# Patient Record
Sex: Female | Born: 1960 | Race: White | Hispanic: No | Smoking: Never smoker
Health system: Southern US, Community
[De-identification: ages and names within clinical notes are randomized; demographics above are authoritative.]

## PROBLEM LIST (undated history)

## (undated) DIAGNOSIS — Z9089 Acquired absence of other organs: Secondary | ICD-10-CM

## (undated) DIAGNOSIS — N760 Acute vaginitis: Secondary | ICD-10-CM

## (undated) DIAGNOSIS — I341 Nonrheumatic mitral (valve) prolapse: Secondary | ICD-10-CM

## (undated) HISTORY — DX: Nonrheumatic mitral (valve) prolapse: I34.1

## (undated) HISTORY — PX: TONSILLECTOMY: SUR1361

## (undated) HISTORY — DX: Acute vaginitis: N76.0

## (undated) HISTORY — DX: Acquired absence of other organs: Z90.89

---

## 2006-09-07 ENCOUNTER — Encounter: Payer: Self-pay | Admitting: Family Medicine

## 2006-09-07 ENCOUNTER — Encounter (INDEPENDENT_AMBULATORY_CARE_PROVIDER_SITE_OTHER): Payer: Self-pay | Admitting: Specialist

## 2006-09-07 ENCOUNTER — Other Ambulatory Visit: Admission: RE | Admit: 2006-09-07 | Discharge: 2006-09-07 | Payer: Self-pay | Admitting: Family Medicine

## 2006-09-07 ENCOUNTER — Ambulatory Visit: Payer: Self-pay | Admitting: Family Medicine

## 2006-09-07 LAB — CONVERTED CEMR LAB

## 2006-09-14 ENCOUNTER — Encounter: Payer: Self-pay | Admitting: Family Medicine

## 2006-09-18 ENCOUNTER — Encounter: Payer: Self-pay | Admitting: Family Medicine

## 2006-09-18 LAB — CONVERTED CEMR LAB
ALT: 18 units/L (ref 0–40)
Calcium: 9 mg/dL (ref 8.4–10.5)
Chloride: 104 meq/L (ref 96–112)
Cholesterol: 192 mg/dL (ref 0–200)
HDL: 48 mg/dL (ref >39)
LDL Cholesterol: 113 mg/dL — ABNORMAL HIGH (ref 0–99)
Potassium: 4.6 meq/L (ref 3.5–5.3)
Sodium: 140 meq/L (ref 135–145)
Total Bilirubin: 0.8 mg/dL (ref 0.3–1.2)
Total CHOL/HDL Ratio: 4
Triglycerides: 154 mg/dL — ABNORMAL HIGH (ref <150)

## 2006-11-07 ENCOUNTER — Encounter: Payer: Self-pay | Admitting: Family Medicine

## 2007-01-23 ENCOUNTER — Ambulatory Visit: Payer: Self-pay | Admitting: Family Medicine

## 2007-01-23 LAB — CONVERTED CEMR LAB
Bilirubin Urine: NEGATIVE
Glucose, Urine, Semiquant: NEGATIVE
WBC Urine, dipstick: NEGATIVE
pH: 5.5

## 2007-01-24 ENCOUNTER — Encounter: Payer: Self-pay | Admitting: Family Medicine

## 2007-01-24 ENCOUNTER — Telehealth (INDEPENDENT_AMBULATORY_CARE_PROVIDER_SITE_OTHER): Payer: Self-pay | Admitting: *Deleted

## 2007-05-01 ENCOUNTER — Ambulatory Visit: Payer: Self-pay | Admitting: Family Medicine

## 2007-05-01 DIAGNOSIS — N760 Acute vaginitis: Secondary | ICD-10-CM | POA: Insufficient documentation

## 2007-05-01 DIAGNOSIS — R109 Unspecified abdominal pain: Secondary | ICD-10-CM

## 2007-05-01 DIAGNOSIS — N390 Urinary tract infection, site not specified: Secondary | ICD-10-CM

## 2007-05-01 LAB — CONVERTED CEMR LAB
Clue Cells Wet Prep HPF POC: NONE SEEN
Glucose, Urine, Semiquant: NEGATIVE
Specific Gravity, Urine: 1.02
pH: 5.5

## 2007-05-07 ENCOUNTER — Ambulatory Visit: Payer: Self-pay | Admitting: Family Medicine

## 2007-05-07 DIAGNOSIS — R1013 Epigastric pain: Secondary | ICD-10-CM

## 2007-05-17 ENCOUNTER — Ambulatory Visit: Payer: Self-pay | Admitting: Family Medicine

## 2007-05-17 DIAGNOSIS — K3189 Other diseases of stomach and duodenum: Secondary | ICD-10-CM | POA: Insufficient documentation

## 2007-05-17 DIAGNOSIS — R319 Hematuria, unspecified: Secondary | ICD-10-CM | POA: Insufficient documentation

## 2007-05-17 DIAGNOSIS — R1013 Epigastric pain: Secondary | ICD-10-CM

## 2007-05-17 DIAGNOSIS — N926 Irregular menstruation, unspecified: Secondary | ICD-10-CM

## 2007-05-17 LAB — CONVERTED CEMR LAB
Ketones, urine, test strip: NEGATIVE
Nitrite: NEGATIVE
Specific Gravity, Urine: >=1.03
Urobilinogen, UA: NEGATIVE

## 2007-06-17 ENCOUNTER — Encounter: Payer: Self-pay | Admitting: Family Medicine

## 2007-07-17 ENCOUNTER — Ambulatory Visit: Payer: Self-pay | Admitting: Family Medicine

## 2007-07-17 DIAGNOSIS — K8020 Calculus of gallbladder without cholecystitis without obstruction: Secondary | ICD-10-CM | POA: Insufficient documentation

## 2007-07-17 LAB — CONVERTED CEMR LAB
ALT: 10 units/L (ref 0–35)
AST: 12 units/L (ref 0–37)
Albumin: 4.4 g/dL (ref 3.5–5.2)
BUN: 9 mg/dL (ref 6–23)
CO2: 24 meq/L (ref 19–32)
Creatinine, Ser: 0.86 mg/dL (ref 0.40–1.20)
Glucose, Bld: 77 mg/dL (ref 70–99)
HCT: 43.6 % (ref 36.0–46.0)
MCHC: 31.9 g/dL (ref 30.0–36.0)
MCV: 93.8 fL (ref 78.0–100.0)
Potassium: 4 meq/L (ref 3.5–5.3)
RDW: 13.4 % (ref 11.5–14.0)
Total Protein: 7.1 g/dL (ref 6.0–8.3)

## 2007-08-21 ENCOUNTER — Ambulatory Visit (HOSPITAL_COMMUNITY): Admission: RE | Admit: 2007-08-21 | Discharge: 2007-08-21 | Payer: Self-pay | Admitting: Surgery

## 2007-08-21 ENCOUNTER — Encounter (INDEPENDENT_AMBULATORY_CARE_PROVIDER_SITE_OTHER): Payer: Self-pay | Admitting: Surgery

## 2007-08-21 HISTORY — PX: CHOLECYSTECTOMY: SHX55

## 2007-09-09 ENCOUNTER — Encounter: Payer: Self-pay | Admitting: Family Medicine

## 2008-01-14 IMAGING — RF DG CHOLANGIOGRAM OPERATIVE
1 series · 4 of 4 positions shown · non-contrast
Comparison: none

CLINICAL DATA: Cholelithiasis

[Series 1: run · 4 of 73 frames shown]
[frame 11/73]
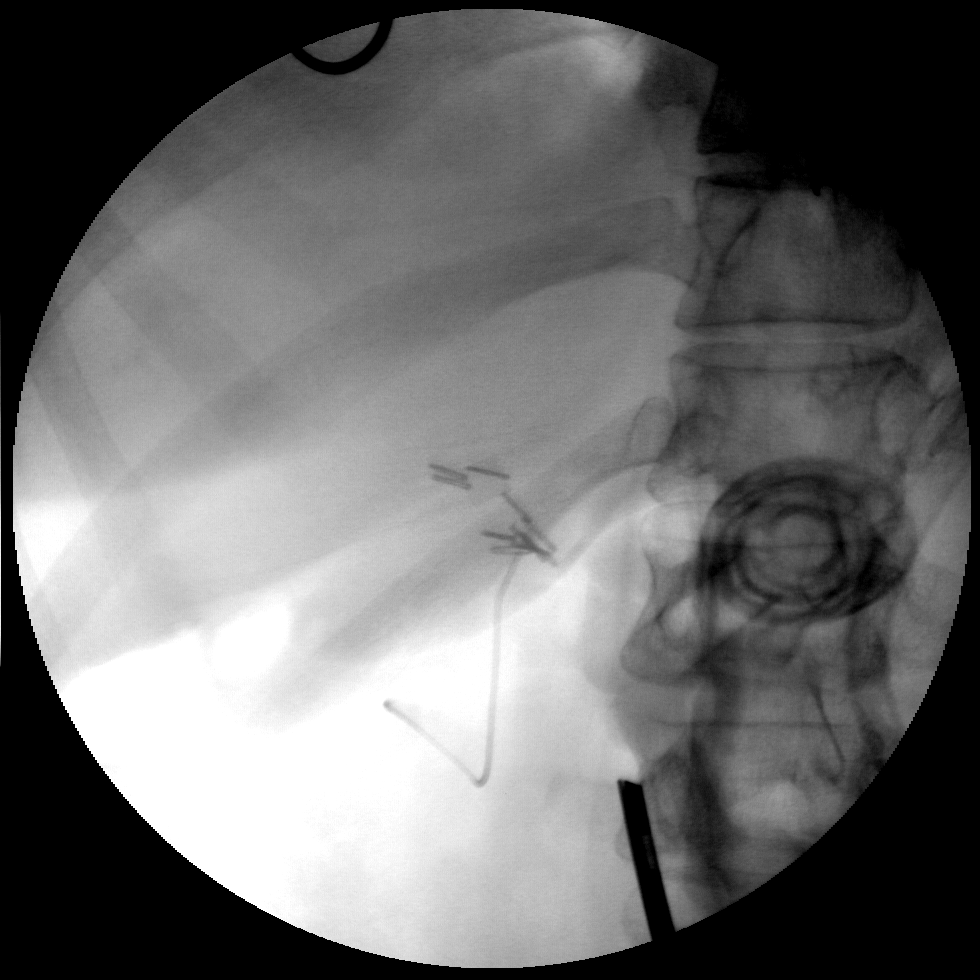
[frame 37/73]
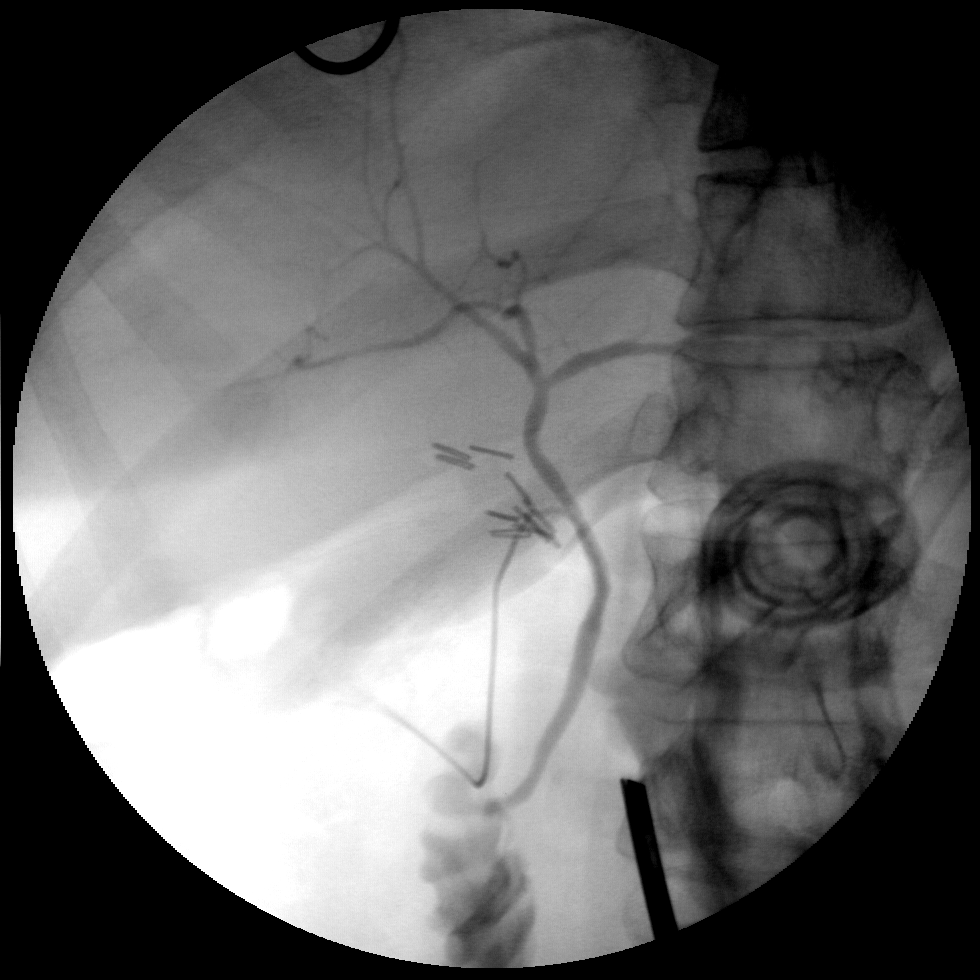
[frame 63/73]
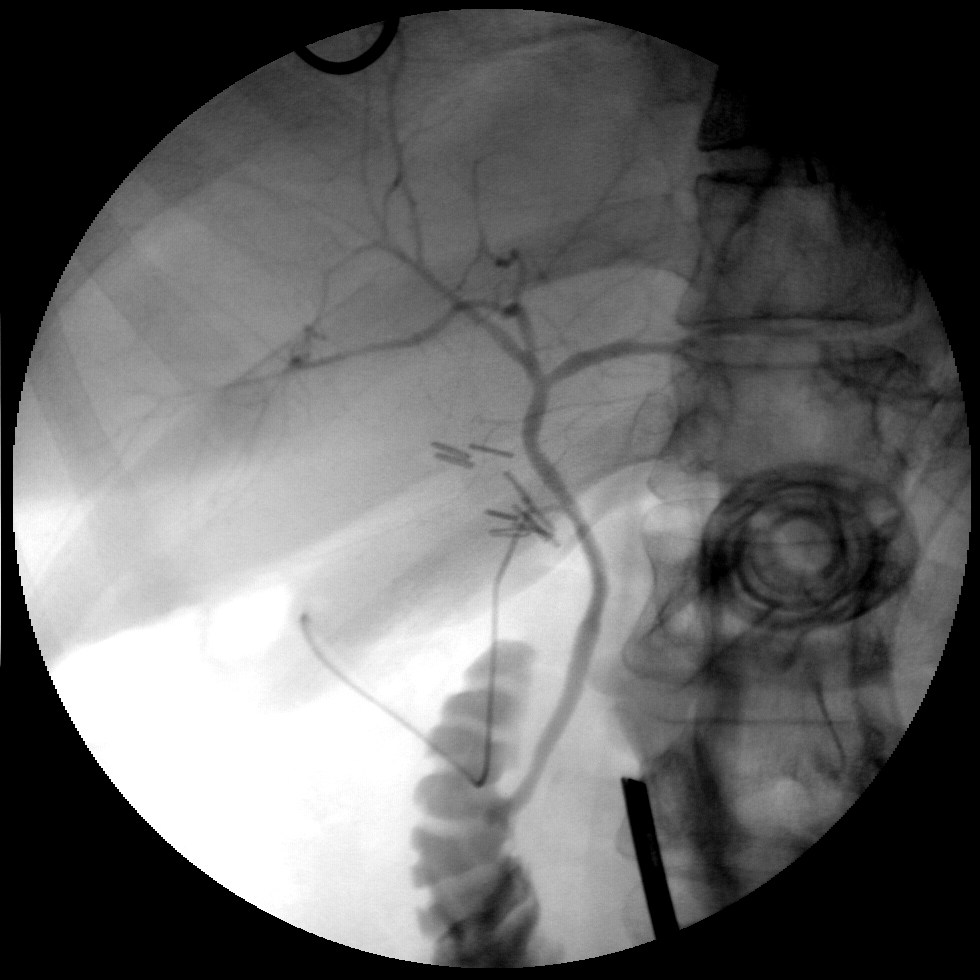
[frame 73/73]
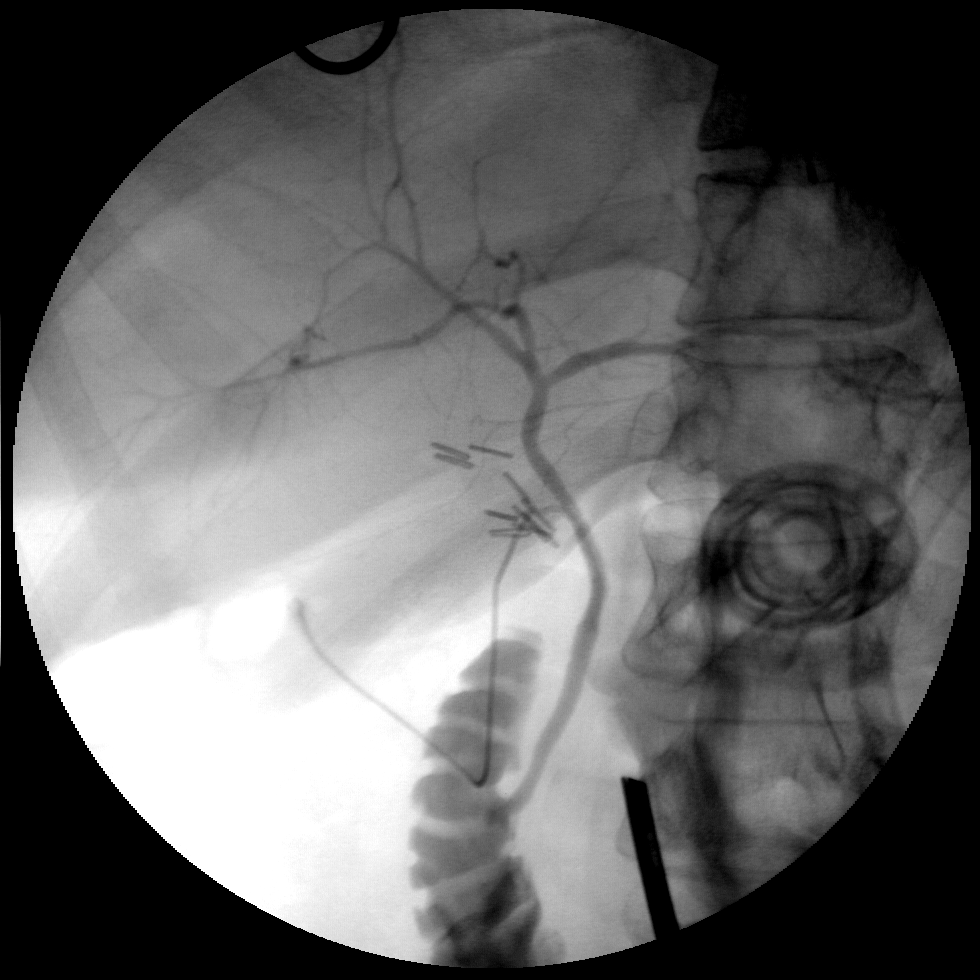

[4 of 4 positions shown; findings below may reference images not displayed]

INTRAOPERATIVE CHOLANGIOGRAM:

73  images from intraoperative C-arm fluoroscopy demonstrate  opacification of
the common bile duct. No filling defects to suggest retained stones. There is
incomplete evaluation of intrahepatic biliary tree, which appears decompressed
centrally. Contrast appears to flow on into decompressed duodenum.
IMPRESSION: 1. Negative for retained common duct stone

## 2008-09-15 ENCOUNTER — Ambulatory Visit: Payer: Self-pay | Admitting: Family Medicine

## 2008-09-15 DIAGNOSIS — L299 Pruritus, unspecified: Secondary | ICD-10-CM | POA: Insufficient documentation

## 2008-09-18 ENCOUNTER — Telehealth: Payer: Self-pay | Admitting: Family Medicine

## 2009-03-22 ENCOUNTER — Ambulatory Visit: Payer: Self-pay | Admitting: Family Medicine

## 2009-03-22 ENCOUNTER — Encounter: Payer: Self-pay | Admitting: Family Medicine

## 2009-03-22 ENCOUNTER — Encounter: Admission: RE | Admit: 2009-03-22 | Discharge: 2009-03-22 | Payer: Self-pay | Admitting: Family Medicine

## 2009-03-22 ENCOUNTER — Other Ambulatory Visit: Admission: RE | Admit: 2009-03-22 | Discharge: 2009-03-22 | Payer: Self-pay | Admitting: Family Medicine

## 2009-03-22 DIAGNOSIS — N951 Menopausal and female climacteric states: Secondary | ICD-10-CM

## 2009-03-24 DIAGNOSIS — R928 Other abnormal and inconclusive findings on diagnostic imaging of breast: Secondary | ICD-10-CM | POA: Insufficient documentation

## 2009-03-25 ENCOUNTER — Encounter: Payer: Self-pay | Admitting: Family Medicine

## 2009-03-25 LAB — CONVERTED CEMR LAB
Albumin: 4.3 g/dL (ref 3.5–5.2)
Alkaline Phosphatase: 65 units/L (ref 39–117)
Calcium: 9.2 mg/dL (ref 8.4–10.5)
Chloride: 108 meq/L (ref 96–112)
Cholesterol: 189 mg/dL (ref 0–200)
Glucose, Bld: 95 mg/dL (ref 70–99)
LDL Cholesterol: 125 mg/dL — ABNORMAL HIGH (ref 0–99)
Progesterone: 0.6 ng/mL
TSH: 2.909 microintl units/mL (ref 0.350–4.500)
Total CHOL/HDL Ratio: 4.1

## 2009-03-26 ENCOUNTER — Encounter: Admission: RE | Admit: 2009-03-26 | Discharge: 2009-03-26 | Payer: Self-pay | Admitting: Family Medicine

## 2009-03-29 ENCOUNTER — Encounter: Payer: Self-pay | Admitting: Family Medicine

## 2009-04-20 ENCOUNTER — Telehealth: Payer: Self-pay | Admitting: Family Medicine

## 2010-12-12 ENCOUNTER — Encounter: Payer: Self-pay | Admitting: Family Medicine

## 2010-12-29 ENCOUNTER — Ambulatory Visit (INDEPENDENT_AMBULATORY_CARE_PROVIDER_SITE_OTHER): Payer: Commercial Indemnity | Admitting: Family Medicine

## 2010-12-29 ENCOUNTER — Encounter: Payer: Self-pay | Admitting: Family Medicine

## 2010-12-29 ENCOUNTER — Other Ambulatory Visit (HOSPITAL_COMMUNITY)
Admission: RE | Admit: 2010-12-29 | Discharge: 2010-12-29 | Disposition: A | Discharge status: Home / Self Care | Payer: Commercial Indemnity | Source: Ambulatory Visit | Attending: Family Medicine | Admitting: Family Medicine

## 2010-12-29 ENCOUNTER — Other Ambulatory Visit: Payer: Self-pay | Admitting: Family Medicine

## 2010-12-29 DIAGNOSIS — N329 Bladder disorder, unspecified: Secondary | ICD-10-CM | POA: Insufficient documentation

## 2010-12-29 DIAGNOSIS — Z01419 Encounter for gynecological examination (general) (routine) without abnormal findings: Secondary | ICD-10-CM

## 2010-12-29 DIAGNOSIS — Z780 Asymptomatic menopausal state: Secondary | ICD-10-CM | POA: Insufficient documentation

## 2010-12-29 LAB — CYTOLOGY - PAP: Pap Smear: NORMAL

## 2010-12-30 ENCOUNTER — Encounter: Payer: Self-pay | Admitting: Family Medicine

## 2010-12-30 LAB — CONVERTED CEMR LAB
Albumin: 4.8 g/dL (ref 3.5–5.2)
BUN: 11 mg/dL (ref 6–23)
CO2: 29 meq/L (ref 19–32)
Chloride: 104 meq/L (ref 96–112)
Creatinine, Ser: 0.85 mg/dL (ref 0.40–1.20)
HDL: 46 mg/dL (ref >39)
LDL Cholesterol: 153 mg/dL — ABNORMAL HIGH (ref 0–99)
Potassium: 4.4 meq/L (ref 3.5–5.3)
Sodium: 142 meq/L (ref 135–145)
Total CHOL/HDL Ratio: 4.8

## 2011-01-05 NOTE — Assessment & Plan Note (Signed)
Summary: CPE w/ pap   Vital Signs:  Patient profile:   50 year old female Height:      64.5 inches Weight:      159 pounds BMI:     26.97 O2 Sat:      98 % on Room air Pulse rate:   84 / minute BP sitting:   130 / 90  (left arm) Cuff size:   large  Vitals Entered By: Payton Spark CMA (December 29, 2010 9:53 AM)  O2 Flow:  Room air CC: CPE w/ pap   Primary Care Provider:  Seymour Bars D.O.  CC:  CPE w/ pap.  History of Present Illness: 50 yo WF presents for CPE with pap.  She is a [place] female, now officially postmenopausal ( ~2 yrs since last period).  Married, monogamous, non smoker w/ o fam hx of premature heart dz, colon cancer or breast cancer.  Immunizations are UTD.  She is due for her mammogram and needs a baseline DEXA scan.  She is due for fasting labs and her first colonoscopy.  She has had a difficult year dealing with her eldest daughter's drug abuse problem.   Current Medications (verified): 1)  Multivitamin .Marland Kitchen.. 1 Tab By Mouth Qd  Allergies (verified): No Known Drug Allergies  Past History:  Past Medical History: Reviewed history from 09/19/2006 and no changes required. MVP recurrent vaginosis G3P3 NSVDs Ascus pap w/ neg HPV 10-07  Past Surgical History: Reviewed history from 03/22/2009 and no changes required. Tonsillectomy lap chole 10-08 [facility]  Family History: Reviewed history from 03/22/2009 and no changes required. mother alive and healthy father died of Parkinsons Dz (Dx age 26) and Alzheimers cousin with DM P aunt with post menopausal breast cancer brother healthy  Social History: Reviewed history from 09/07/2006 and no changes required. Occupation: Psychologist, occupational for [employer] [place], travels Married to [employer] Never Smoked Regular exercise-no 3 kids - 30 yo daughter (out of house), 25 yo daughter, 60 yo son  Review of Systems  The patient denies anorexia, fever, weight loss, weight gain, vision loss, decreased hearing, hoarseness, chest pain,  syncope, dyspnea on exertion, peripheral edema, prolonged cough, headaches, hemoptysis, abdominal pain, melena, hematochezia, severe indigestion/heartburn, hematuria, incontinence, genital sores, muscle weakness, suspicious skin lesions, transient blindness, difficulty walking, depression, unusual weight change, abnormal bleeding, enlarged lymph nodes, angioedema, breast masses, and testicular masses.    Physical Exam  General:  alert, well-developed, well-nourished, and well-hydrated.   Head:  normocephalic and atraumatic.   Eyes:  pupils equal, pupils round, and pupils reactive to light.   Ears:  no external deformities.   Nose:  no nasal discharge.   Mouth:  good dentition and pharynx pink and moist.   Neck:  no masses.   Breasts:  No mass, nodules, thickening, tenderness, bulging, retraction, inflamation, nipple discharge or skin changes noted.   Lungs:  Normal respiratory effort, chest expands symmetrically. Lungs are clear to auscultation, no crackles or wheezes. Heart:  Normal rate and regular rhythm. S1 and S2 normal without gallop, murmur, click, rub or other extra sounds. Abdomen:  Bowel sounds positive,abdomen soft and non-tender without masses, organomegaly  Genitalia:  Pelvic Exam:        External: normal female genitalia without lesions or masses        Vagina: normal without lesions or masses, grade 2 prolapse of bladder into vagina        Cervix: normal without lesions or masses        Adnexa:  normal bimanual exam without masses or fullness        Uterus: normal by palpation        Pap smear: performed Pulses:  2+ radial and pedal pulses Extremities:  no LE edema Skin:  acne scarring of face Cervical Nodes:  No lymphadenopathy noted Psych:  good eye contact, not anxious appearing, and not depressed appearing.     Impression & Recommendations:  Problem # 1:  ROUTINE GYNECOLOGICAL EXAMINATION (ICD-V72.31) Keeping healthy checklist for women reviewed. Thin prep pap done.   Update fasting labs. Immunizations UTD. Schedule colonoscopy, DEXA and mammogram. Working on healthy diet, regular exercise, wt loss for BMI of 26.9 = overwt. BP at goal.  Complete Medication List: 1)  Multivitamin  .Marland Kitchen.. 1 tab by mouth qd  Other Orders: Gastroenterology Referral (GI) T-Mammography Bilateral Screening (14782) T-DXA Bone Density/ Appendicular [phone]) T-Dual DXA Bone Density/ Axial (30865) T-Comprehensive Metabolic Panel ([id number]) T-Lipid Profile ([id number]) Urology Referral (Urology)  Patient Instructions: 1)  Update fasting labs today. 2)  Update mammogram and DEXA scan at [facility]. 3)  Will call you with pap results next wk. 4)  Will schedule your screening colonoscopy. 5)  Call me if any problems. 6)  REturn for next PHYSICAL in 1 yr.   Orders Added: 1)  Gastroenterology Referral [GI] 2)  T-Mammography Bilateral Screening [77057] 3)  T-DXA Bone Density/ Appendicular [77081] 4)  T-Dual DXA Bone Density/ Axial [77080] 5)  T-Comprehensive Metabolic Panel [[id number]] 6)  T-Lipid Profile [[id number]] 7)  Urology Referral [Urology] 8)  Est. Patient age 17-64 [phone]

## 2011-04-04 NOTE — Op Note (Signed)
NAMEKYLEI, PURINGTON NO.:  [id number]   MEDICAL RECORD NO.:  [id number]          PATIENT TYPE:  AMB   LOCATION:  DAY                          FACILITY:  [facility]   PHYSICIAN:  Ardeth Sportsman, MD     DATE OF BIRTH:  30-Jun-1961   DATE OF PROCEDURE:  08/21/2007  DATE OF DISCHARGE:                               OPERATIVE REPORT   PRIMARY CARE PHYSICIAN:  Seymour Bars, D.O., and she is the requesting  physician, with [facility] in [place], I believe.   SURGEON:  Ardeth Sportsman, MD   ASSISTANT:  Norval Gable, PA student.   PREOPERATIVE DIAGNOSIS:  Symptomatic cholecystolithiasis.   POSTOPERATIVE DIAGNOSES:  1. Symptomatic cholecystolithiasis.  2. Probable chronic cholecystitis.  3. Mild steatohepatosis.   PROCEDURE PERFORMED:  Laparoscopic cholecystectomy with intraoperative  cholangiograms.   ANESTHESIA:  1. General anesthesia.  2. Local anesthetic in a field block around all port sites.   SPECIMENS:  Gallbladder.   DRAINS:  None,   ESTIMATED BLOOD LOSS:  About 20 mL.   COMPLICATIONS:  None apparent.   INDICATIONS:  Ms. Rohm is a 50 year old female who has classic  symptoms of biliary colic and known gallstones and a strong family  history of symptomatic cholecystolithiasis.  I actually took her  daughter's gallbladder out a few weeks ago.   Anatomy and physiology of hepatobiliary and pancreatic function was  discussed.  Pathophysiology of cholecystolithiasis with its risks of  gallstone pancreatitis, choledocholithiasis, cholecystitis and other  symptoms were discussed.  Options were discussed and a recommendation  was made for a laparoscopic cholecystectomy with intraoperative  cholangiogram.  Risks such stroke, heart attack, deep venous thrombosis,  pulmonary embolism and death were discussed.  Risks such as bleeding,  need for transfusion, wound infection, abscess, injury to other organs,  prolonged pain and other risks  were discussed.  Risk of bile duct injury  resulting in the need of operative reconstruction, percutaneous drainage  and/or stenting and other risks were discussed.  Questions were answered  and she agreed to proceed.   OPERATIVE FINDINGS:  She had a mild gallbladder thickened wall with  moderate-sized stones.  Her cholangiogram was normal.  She had some mild  fatty liver but no strong evidence of hepatitis.   DESCRIPTION OF PROCEDURE:  Informed consent was confirmed.  The patient  received IV cefazolin given her history of mitral valve prolapse.  She  urinated just prior to going to the operating room.  She had sequential  compression devices active during the entire case.  She was positioned  supine with both arms tucked.  She underwent general anesthesia without  any difficulty.  Her abdomen was prepped and draped in a sterile  fashion.   Entry was gained to the abdomen with the patient in steep reverse  Trendelenburg, her right side up, through a stab incision in the right  upper quadrant and placement of a 5-mm port using 5 mm/0-degree scope  and optical entry technique.  Capnoperitoneum to 15 mmHg provided good  abdominal insufflation.  Under direct visualization 5-mm ports were  placed  through the umbilicus and also on the right flank.  A 10-mm port  was tunneled through the falciform ligament in the subxiphoid region.   Gallbladder fundus was grasped and elevated cephalad.  Peritoneal  coverings between the gallbladder and liver were freed on the  anteromedial and posterolateral aspects.  Circumferential dissection was  done to free the proximal third of the gallbladder off the liver bed.  She had an arterial branch going to her anteromedial gallbladder  consistent with the anterior branch of the cystic artery.  One clip on  the gallbladder and two clips slightly proximally were made and this was  transected.  She had a similar branch going on the posterior wall and  this was  carefully freed off and one clip on the gallbladder side and  two clips slightly proximal were made.  This was done after getting a  critical view.  This left one structure going from the gallbladder  infundibulum down to the porta hepatis consistent with the cystic duct.  One clip on the gallbladder was performed and the cystic duct was  transected.  Actually, in trying to do a partial cyst ductotomy I ended  up doing a complete cyst ductotomy because her cystic duct was extremely  narrow, probably about 1.5 mm in diameter.   A 5-French cholangiocatheter was placed through a stab incision in the  right upper quadrant flush and then was able to ultimately get that onto  the cystic duct stump and with a gentle clip.  A cholangiogram was run  using diluted radiopaque contrast and continuous fluoroscopy.  Contrast  flowed well from a side branch consistent with cystic duct cannulation.  Contrast flowed well in the common hepatic duct up into the right and  left intrahepatic chains, across a normal common bile duct, across a  normal ampulla into a normal duodenum with no filling defects and no  evidence of any bile leak, consistent with a normal cholangiogram.  The  cholangiocatheter was removed.  A 0 Vicryl Endoloop was placed on the  cystic duct stump about a centimeter from the transection and four clips  were placed around this for good cystic duct occlusion.   The gallbladder infundibulum was grasped and the gallbladder was removed  out the subxiphoid port with gentle dilation.  During this,  unfortunately, there was some spillage of stones but I was able free  those out carefully.  I did have to open the fascial defect to allow the  gallbladder to come out since it was chock-full of stones.  The fascial  defect was large enough for my finger to pass, so I closed it using a  laparoscopic suture passer using a 0 Vicryl stitch to good result.  Copious irrigation of about 500 mL was done  to help clear out the  subxiphoid wound to a good clear return.  There was no evidence of any  stones or any other fragments in the peritoneal cavity or in the wound.   Careful inspection was done on the liver bed and in the porta with clips  intact  on the cystic arterial branches and the cystic duct stump.  There was no bleeding and no leak of bile.  Over a liter of irrigation  was done in the peritoneal cavity with clear return.  The three  abdominal ports were removed with no bleeding from the peritoneum or the  skin.  Capnoperitoneum was actively evacuated.  Umbilical port was  removed.  Fascial stitch was  tightened down in the subxiphoid region.  Skin was closed using 4-0 Monocryl stitch.  The patient was extubated  and sent to the recovery room in stable condition.   I explained the operative findings to the patient's family.  Postop  instructions were given.  The patient expressed understanding and  appreciation.      Ardeth Sportsman, MD  Electronically Signed     SCG/MEDQ  D:  08/21/2007  T:  08/21/2007  Job:  [id number]   cc:   Seymour Bars, D.O.  [facility].  [address]  [place], [place] [address]

## 2011-05-18 ENCOUNTER — Telehealth: Payer: Self-pay | Admitting: Family Medicine

## 2011-05-18 NOTE — Telephone Encounter (Signed)
Pt call returned, but N/A.  Had to leave voice mail message that call returned. Jarvis Newcomer, LPN [facility]

## 2011-08-31 LAB — COMPREHENSIVE METABOLIC PANEL
Albumin: 4
Alkaline Phosphatase: 55
Calcium: 9.2
Chloride: 107
Creatinine, Ser: 0.87
GFR calc Af Amer: >60
GFR calc non Af Amer: >60
Sodium: 141
Total Bilirubin: 0.7
Total Protein: 7.2

## 2011-08-31 LAB — PREGNANCY, URINE: Preg Test, Ur: NEGATIVE

## 2011-08-31 LAB — CBC
MCV: 87
WBC: 7.4

## 2012-02-15 ENCOUNTER — Encounter: Payer: Self-pay | Admitting: *Deleted

## 2012-02-19 ENCOUNTER — Encounter: Payer: Self-pay | Admitting: Physician Assistant

## 2012-02-19 ENCOUNTER — Ambulatory Visit (INDEPENDENT_AMBULATORY_CARE_PROVIDER_SITE_OTHER): Payer: Commercial Indemnity | Admitting: Physician Assistant

## 2012-02-19 VITALS — BP 120/74 | HR 75 | Wt 161.0 lb

## 2012-02-19 DIAGNOSIS — N390 Urinary tract infection, site not specified: Secondary | ICD-10-CM

## 2012-02-19 DIAGNOSIS — R202 Paresthesia of skin: Secondary | ICD-10-CM

## 2012-02-19 DIAGNOSIS — R209 Unspecified disturbances of skin sensation: Secondary | ICD-10-CM

## 2012-02-19 DIAGNOSIS — R200 Anesthesia of skin: Secondary | ICD-10-CM

## 2012-02-19 LAB — POCT URINALYSIS DIPSTICK
Protein, UA: NEGATIVE
Spec Grav, UA: 1.025
pH, UA: 5.5

## 2012-02-19 MED ORDER — CIPROFLOXACIN HCL 500 MG PO TABS: 500.0000 mg | ORAL_TABLET | Freq: Two times a day (BID) | ORAL | Status: AC

## 2012-02-19 NOTE — Progress Notes (Signed)
  Subjective:    Patient ID: Ana Warren, female    DOB: 28-Aug-1961, 51 y.o.   MRN: [id number]  HPI Patient presents to the clinic with painful urination. She started having recurring UTIs about once a month. The latest episode she has had pressure and pain with urination for the last 4 months. She's seen a urologist and they did not do anything for her except learned that she did not have any bladder flow or muscle issues. She's only ever been on short durations of antibiotics. For the recent episode she has tried hydration, AZO tablets and nothing has made better. She does not recognize that there is a trigger for her UTIs such as sexual intercourse. If anything she thinks the trigger could be stress.  She's also  Having some numbness and tingling of both arms and both legs. This numbness and tingling has been ongoing off and on for the last year.she is not a diabetic. She states that the numbness is worse when she crosses her legs or when she wakes up in the morning. She denies any back pain, shoulder pain, or neck pain. She did not have any pain when she walks or have any color changes in her extremities.   Review of Systems     Objective:   Physical Exam  Constitutional: She is oriented to person, place, and time. She appears well-developed and well-nourished.  HENT:  Head: Normocephalic and atraumatic.  Cardiovascular: Normal rate, regular rhythm and normal heart sounds.   Pulmonary/Chest: Effort normal and breath sounds normal.       No CVA tenderness.  Abdominal: Soft. Bowel sounds are normal. She exhibits no distension and no mass. There is no tenderness.  Neurological: She is alert and oriented to person, place, and time.  Skin: Skin is warm and dry.  Psychiatric: She has a normal mood and affect. Her behavior is normal.          Assessment & Plan:  UTI-patient was given Cipro for 10 days.she was encouraged to stay hydrated. Patient was encouraged to come back to recheck  urine to make sure infection has cleared and she also have a complete physical. We discussed that at CPE to discuss other health maintenance issues that are due along with the conversation if we need to start prophylactically treating her UTIs.  Numbness of extremities-the patient will check her B12 complete physical as well as the CBC.

## 2012-02-19 NOTE — Patient Instructions (Addendum)
Start Cipro twice a day for 10 days. Recheck urine after finishing antibiotic and schedule CPE and get bloodwork. Exercise 3 days a week for at least 30 minutes.

## 2012-04-10 ENCOUNTER — Telehealth: Payer: Self-pay | Admitting: *Deleted

## 2012-04-10 NOTE — Telephone Encounter (Signed)
Pt states she seem to get better for a few days. States now that she is having burning, frequency, painful urination. Please advise. States if she needs to make appt that she could do an earlier appt due to working in [employer]. She was seen on 02/19/12 by you.

## 2012-04-10 NOTE — Telephone Encounter (Signed)
We will need appt to check urine. I do think at this point we need to start prophylactic treatment at this point. Reminder needs CPE if she wants to schedule both together we can.

## 2012-04-10 NOTE — Telephone Encounter (Signed)
Pt informed

## 2012-04-17 ENCOUNTER — Ambulatory Visit (INDEPENDENT_AMBULATORY_CARE_PROVIDER_SITE_OTHER): Payer: Commercial Indemnity | Admitting: Physician Assistant

## 2012-04-17 ENCOUNTER — Encounter: Payer: Self-pay | Admitting: Physician Assistant

## 2012-04-17 VITALS — BP 122/78 | HR 73 | Ht 64.0 in | Wt 165.0 lb

## 2012-04-17 DIAGNOSIS — R300 Dysuria: Secondary | ICD-10-CM

## 2012-04-17 DIAGNOSIS — R5383 Other fatigue: Secondary | ICD-10-CM

## 2012-04-17 DIAGNOSIS — Z13220 Encounter for screening for lipoid disorders: Secondary | ICD-10-CM

## 2012-04-17 DIAGNOSIS — Z131 Encounter for screening for diabetes mellitus: Secondary | ICD-10-CM

## 2012-04-17 DIAGNOSIS — Z1239 Encounter for other screening for malignant neoplasm of breast: Secondary | ICD-10-CM

## 2012-04-17 DIAGNOSIS — Z0000 Encounter for general adult medical examination without abnormal findings: Secondary | ICD-10-CM

## 2012-04-17 LAB — CBC WITH DIFFERENTIAL/PLATELET
MCH: 29 pg (ref 26.0–34.0)
MCHC: 33.5 g/dL (ref 30.0–36.0)
MCV: 86.5 fL (ref 78.0–100.0)
Monocytes Absolute: 0.6 10*3/uL (ref 0.1–1.0)
Monocytes Relative: 9 % (ref 3–12)
Neutro Abs: 3.7 10*3/uL (ref 1.7–7.7)
Neutrophils Relative %: 59 % (ref 43–77)
RBC: 4.59 MIL/uL (ref 3.87–5.11)
WBC: 6.3 10*3/uL (ref 4.0–10.5)

## 2012-04-17 LAB — COMPLETE METABOLIC PANEL WITH GFR
AST: 16 U/L (ref 0–37)
Albumin: 4.4 g/dL (ref 3.5–5.2)
BUN: 10 mg/dL (ref 6–23)
Creat: 0.89 mg/dL (ref 0.50–1.10)
Glucose, Bld: 86 mg/dL (ref 70–99)
Potassium: 4.6 mEq/L (ref 3.5–5.3)
Total Bilirubin: 0.5 mg/dL (ref 0.3–1.2)
Total Protein: 6.5 g/dL (ref 6.0–8.3)

## 2012-04-17 LAB — POCT URINALYSIS DIPSTICK
Bilirubin, UA: NEGATIVE
Glucose, UA: NEGATIVE
Nitrite, UA: NEGATIVE
Spec Grav, UA: >=1.03

## 2012-04-17 LAB — LIPID PANEL
Cholesterol: 202 mg/dL — ABNORMAL HIGH (ref 0–200)
HDL: 40 mg/dL (ref >39)
Triglycerides: 138 mg/dL (ref <150)

## 2012-04-17 MED ORDER — SULFAMETHOXAZOLE-TRIMETHOPRIM 800-160 MG PO TABS: 1.0000 | ORAL_TABLET | Freq: Two times a day (BID) | ORAL | Status: AC

## 2012-04-17 NOTE — Patient Instructions (Signed)
Continue with Calcium 4 servings of dairy daily or 500mg  twice a day. Will get labs and call with results. Vaccine are up to date. Could consider Zostavax. Exercise at least 150 minutes a week along with making good diet choices. Decrease saturated fats and increase protein. Will give Bactrim for 3 days. If having symptoms of UTI come in for UA. If document another in month we will start prophylactic therapy.   Will call with colonoscopy and mammogram referral.   Interstitial Cystitis Interstitial cystitis (IC) is a condition that results in discomfort or pain in the bladder and the surrounding pelvic region. The symptoms can be different from case to case and even in the same individual. People may experience:  Mild discomfort.   Pressure.   Tenderness.   Intense pain in the bladder and pelvic area.  CAUSES  Because IC varies so much in symptoms and severity, people studying this disease believe it is not one but several diseases. Some caregivers use the term painful bladder syndrome (PBS) to describe cases with painful urinary symptoms. This may not meet the strictest definition of IC. The term IC / PBS includes all cases of urinary pain that cannot be connected to other causes, such as infection or urinary stones.  SYMPTOMS  Symptoms may include:  An urgent need to urinate.   A frequent need to urinate.   A combination of these symptoms.  Pain may change in intensity as the bladder fills with urine or as it empties. Women's symptoms often get worse during menstruation. They may sometimes experience pain with vaginal intercourse. Some of the symptoms of IC / PBS seem like those of bacterial infection. Tests do not show infection. IC / PBS is far more common in women than in men.  DIAGNOSIS  The diagnosis of IC / PBS is based on:  Presence of pain related to the bladder, usually along with problems of frequency and urgency.   Not finding other diseases that could cause the symptoms.     Diagnostic tests that help rule out other diseases include:   Urinalysis.   Urine culture.   Cystoscopy.   Biopsy of the bladder wall.   Distension of the bladder under anesthesia.   Urine cytology.   Laboratory examination of prostate secretions.  A biopsy is a tissue sample that can be looked at under a microscope. Samples of the bladder and urethra may be removed during a cystoscopy. A biopsy helps rule out bladder cancer. TREATMENT  Scientists have not yet found a cure for IC / PBS. Patients with IC / PBS do not get better with antibiotic therapy. Caregivers cannot predict who will respond best to which treatment. Symptoms may disappear without explanation. Disappearing symptoms may coincide with an event such as a change in diet or treatment. Even when symptoms disappear, they may return after days, weeks, months, or years.  Because the causes of IC / PBS are unknown, current treatments are aimed at relieving symptoms. Many people are helped by one or a combination of the treatments. As researchers learn more about IC / PBS, the list of potential treatments will change. Patients should discuss their options with a caregiver. SURGERY  Surgery should be considered only if all available treatments have failed and the pain is disabling. Many approaches and techniques are used. Each approach has its own advantages and complications. Advantages and complications should be discussed with a urologist. Your caregiver may recommend consulting another urologist for a second opinion. Most caregivers are  reluctant to operate because the outcome is unpredictable. Some people still have symptoms after surgery.   People considering surgery should discuss the potential risks and benefits, side effects, and long- and short-term complications with their family, as well as with people who have already had the procedure. Surgery requires anesthesia, hospitalization, and in some cases weeks or months of  recovery. As the complexity of the procedure increases, so do the chances for complications and for failure.  HOME CARE INSTRUCTIONS   All drugs, even those sold over the counter, have side effects. Patients should always consult a caregiver before using any drug for an extended amount of time. Only take over-the-counter or prescription medicines for pain, discomfort, or fever as directed by your caregiver.   Many patients feel that smoking makes their symptoms worse. How the by-products of tobacco that are excreted in the urine affect IC / PBS is unknown. Smoking is the major known cause of bladder cancer. One of the best things smokers can do for their bladder and their overall health is to quit.   Many patients feel that gentle stretching exercises help relieve IC / PBS symptoms.   Methods vary, but basically patients decide to empty their bladder at designated times and use relaxation techniques and distractions to keep to the schedule. Gradually, patients try to lengthen the time between scheduled voids. A diary in which to record voiding times is usually helpful in keeping track of progress.  MAKE SURE YOU:   Understand these instructions.   Will watch your condition.   Will get help right away if you are not doing well or get worse.  Document Released: 07/07/2004 Document Revised: 10/26/2011 Document Reviewed: 09/21/2008 [facility] Patient Information 2012 [place], [place].

## 2012-04-17 NOTE — Progress Notes (Addendum)
Subjective:    Patient ID: Ana Warren, female    DOB: [birth year], 51 y.o.   MRN: [id number]  HPI      Review of Systems     Objective:   Physical Exam        Assessment & Plan:   Subjective:     Ana Warren is a 51 y.o. female and is here for a comprehensive physical exam. The patient reports pain with urination for the last couple of days. She has a hx of UTI and has been having them monthly. She does not notice that they are assoicated with sex. She has been seen by urology and there is nothing wrong with her flow or bladder.  She does want her vitamin D level and thyroid checked. She is more fatigued lately and having problems losing weight.   History   Social History  . Marital Status: Married    Spouse Name: N/A    Number of Children: N/A  . Years of Education: N/A   Occupational History  . Not on file.   Social History Main Topics  . Smoking status: Never Smoker   . Smokeless tobacco: Not on file  . Alcohol Use: No  . Drug Use: No  . Sexually Active:    Other Topics Concern  . Not on file   Social History Narrative  . No narrative on file   Health Maintenance  Topic Date Due  . Mammogram  02/19/2010  . Colonoscopy  02/19/2010  . Influenza Vaccine  08/20/2012  . Pap Smear  12/30/2013  . Tetanus/tdap  09/07/2016    The following portions of the patient's history were reviewed and updated as appropriate: allergies, current medications, past family history, past medical history, past social history, past surgical history and problem list.  Review of Systems A comprehensive review of systems was negative.   Objective:    BP 122/78  Pulse 73  Ht 5\' 4"  (1.626 m)  Wt 165 lb (74.844 kg)  BMI 28.32 kg/m2  SpO2 98% General appearance: alert, cooperative and appears stated age Head: Normocephalic, without obvious abnormality, atraumatic Eyes: conjunctivae/corneas clear. PERRL, EOM's intact. Fundi benign. Ears: normal TM's and external ear  canals both ears Nose: Nares normal. Septum midline. Mucosa normal. No drainage or sinus tenderness. Throat: lips, mucosa, and tongue normal; teeth and gums normal Neck: no adenopathy, no carotid bruit, no JVD, supple, symmetrical, trachea midline and thyroid not enlarged, symmetric, no tenderness/mass/nodules Back: symmetric, no curvature. ROM normal. No CVA tenderness. Lungs: clear to auscultation bilaterally Breasts: normal appearance, no masses or tenderness Heart: regular rate and rhythm, S1, S2 normal, no murmur, click, rub or gallop Abdomen: soft, non-tender; bowel sounds normal; no masses,  no organomegaly Extremities: extremities normal, atraumatic, no cyanosis or edema Pulses: 2+ and symmetric  Neuro: Grossly intact. Skin: No rashes or lesions.   Assessment:    Healthy female exam.      Plan:    Continue with Calcium 4 servings of dairy daily or 500mg  twice a day. Will get labs and call with results. Vaccine are up to date. Could consider Zostavax. Exercise at least 150 minutes a week along with making good diet choices. Decrease saturated fats and increase protein. UA + for blood and leukocytes.Will give Bactrim for 3 days. If having symptoms of UTI come in for UA. If document another in month we will start prophylactic therapy.   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 She had UTI today but her continual bladder  irritation might be from inflammation. Encourage her to make diet changes and to see if it helped her symptoms.   Will call with colonoscopy and mammogram referral.   See After Visit Summary for Counseling Recommendations

## 2012-04-18 LAB — VITAMIN D 25 HYDROXY (VIT D DEFICIENCY, FRACTURES): Vit D, 25-Hydroxy: 41 ng/mL (ref 30–89)

## 2012-04-25 ENCOUNTER — Telehealth: Payer: Self-pay | Admitting: *Deleted

## 2012-04-25 MED ORDER — CEPHALEXIN 500 MG PO CAPS: 500.0000 mg | ORAL_CAPSULE | Freq: Three times a day (TID) | ORAL | Status: AC

## 2012-04-25 NOTE — Telephone Encounter (Signed)
Pt states that she started the Bactrim that Lesly Rubenstein gave her on Wednesday. States after she took the first dose she got [facility] and her heart started racing. States she didn't take anymore after that. Informed pt not to take again and that it wouodl be listed as an allergy on her chart. Please advise what else can be called in for her. She took Cipro a few weeks ago but states she was told it didn't clear the infection. Please advise.

## 2012-04-25 NOTE — Telephone Encounter (Signed)
OK will cal lin keflex.  If gets worse then make appt or go to UC.

## 2012-04-25 NOTE — Telephone Encounter (Signed)
Pt informed

## 2012-05-28 ENCOUNTER — Ambulatory Visit: Payer: Commercial Indemnity

## 2012-06-04 ENCOUNTER — Telehealth: Payer: Self-pay | Admitting: Family Medicine

## 2012-06-04 NOTE — Telephone Encounter (Signed)
Call pt: due for mammo. Can enter order if needed.

## 2012-06-04 NOTE — Telephone Encounter (Signed)
Cal pt: She is due for mammo. We can enter order if needed.

## 2012-06-05 NOTE — Telephone Encounter (Signed)
Pt has already rescheduled her appt for her mammo

## 2013-07-24 ENCOUNTER — Ambulatory Visit: Payer: Commercial Indemnity | Admitting: Family Medicine

## 2013-07-28 ENCOUNTER — Telehealth: Payer: Self-pay | Admitting: Physician Assistant

## 2013-07-28 NOTE — Telephone Encounter (Signed)
LMOM for pt to return call. 

## 2013-07-28 NOTE — Telephone Encounter (Signed)
Call pt: Mammogram is needed. We can get this order if she would like to get.

## 2013-07-31 ENCOUNTER — Ambulatory Visit (INDEPENDENT_AMBULATORY_CARE_PROVIDER_SITE_OTHER): Payer: Managed Care, Other (non HMO) | Admitting: Family Medicine

## 2013-07-31 ENCOUNTER — Encounter: Payer: Self-pay | Admitting: Family Medicine

## 2013-07-31 VITALS — BP 128/78 | HR 80 | Wt 157.0 lb

## 2013-07-31 DIAGNOSIS — Z1231 Encounter for screening mammogram for malignant neoplasm of breast: Secondary | ICD-10-CM

## 2013-07-31 DIAGNOSIS — R1011 Right upper quadrant pain: Secondary | ICD-10-CM

## 2013-07-31 DIAGNOSIS — R319 Hematuria, unspecified: Secondary | ICD-10-CM

## 2013-07-31 DIAGNOSIS — R109 Unspecified abdominal pain: Secondary | ICD-10-CM

## 2013-07-31 LAB — POCT URINALYSIS DIPSTICK
Glucose, UA: NEGATIVE
Ketones, UA: NEGATIVE
Urobilinogen, UA: 0.2

## 2013-07-31 NOTE — Progress Notes (Signed)
Subjective:    Patient ID: Ana Warren, female    DOB: 1961/05/08, 52 y.o.   MRN: [id number]  HPI RUQ pain x several weeks. No known triggers.  Coming and going.  No positional triggers.  Feels like a stich in her side.  Pain usually last a couple of days. No change in bowels. No fever.  No nausea.  Says bowels will get loose when het nervous. No trauma. Hx of cholecystectomy in 2008. No dysuria.  Not related to eating or not eating. No nausea vomiting or diarrhea. She says it is still a little bit better if she lays flat on her back. No other alleviating symptoms though. She denies any heartburn problems. She denies any shortness of breath or respiratory issues. She denies any hematuria.   Review of Systems      BP 128/78  Pulse 80  Wt 157 lb (71.215 kg)  BMI 26.94 kg/m2    Allergies  Allergen Reactions  . Sulfamethoxazole-Trimethoprim Shortness Of Breath and Palpitations    Past Medical History  Diagnosis Date  . Hx of tonsillectomy   . MVP (mitral valve prolapse)   . Vaginosis     recurrent    Past Surgical History  Procedure Laterality Date  . Tonsillectomy    . Cholecystectomy  10/08    lap    History   Social History  . Marital Status: Married    Spouse Name: N/A    Number of Children: N/A  . Years of Education: N/A   Occupational History  . Not on file.   Social History Main Topics  . Smoking status: Never Smoker   . Smokeless tobacco: Not on file  . Alcohol Use: No  . Drug Use: No  . Sexual Activity:    Other Topics Concern  . Not on file   Social History Narrative  . No narrative on file    Family History  Problem Relation Age of Onset  . Parkinsonism Father 44  . Cancer Paternal Aunt     post menopausal breast CA    Outpatient Encounter Prescriptions as of 07/31/2013  Medication Sig Dispense Refill  . Multiple Vitamin (MULTIVITAMIN) tablet Take 1 tablet by mouth daily.       No facility-administered encounter medications on file as  of 07/31/2013.       Objective:   Physical Exam  Constitutional: She is oriented to person, place, and time. She appears well-developed and well-nourished.  HENT:  Head: Normocephalic and atraumatic.  Cardiovascular: Normal rate, regular rhythm and normal heart sounds.   Pulmonary/Chest: Effort normal and breath sounds normal.  Abdominal: Soft. Bowel sounds are normal. She exhibits no distension and no mass. There is no tenderness. There is no rebound and no guarding.  Musculoskeletal:  No CVA tenderness.  Neurological: She is alert and oriented to person, place, and time.  Skin: Skin is warm and dry.  Psychiatric: She has a normal mood and affect. Her behavior is normal.          Assessment & Plan:  RUQ pain  - unclear etiology as she has had her gallbladder removed about 6 years ago. I would like to start with some blood work to check her liver enzymes electrolytes and a blood count make sure there's no sign infection. Certainly this could be related to a pocket of gas or slow area in the gut but typically this would only last for a few hours and then she would experience relief versus lasting  for 2-3 days the time. Still could be muscular. Also will take a better look at the liver and the common bile duct with ultrasound.  Hematuria-urinalysis dipstick is positive for hematuria. We'll send for culture.

## 2013-08-01 LAB — COMPLETE METABOLIC PANEL WITH GFR
ALT: 13 U/L (ref 0–35)
AST: 13 U/L (ref 0–37)
Albumin: 4.3 g/dL (ref 3.5–5.2)
Alkaline Phosphatase: 62 U/L (ref 39–117)
BUN: 8 mg/dL (ref 6–23)
CO2: 32 mEq/L (ref 19–32)
Chloride: 104 mEq/L (ref 96–112)
GFR, Est Non African American: >89 mL/min
Total Bilirubin: 0.7 mg/dL (ref 0.3–1.2)

## 2013-08-01 LAB — CBC WITH DIFFERENTIAL/PLATELET
Hemoglobin: 13.3 g/dL (ref 12.0–15.0)
Lymphs Abs: 1.7 10*3/uL (ref 0.7–4.0)
Monocytes Relative: 8 % (ref 3–12)
Neutrophils Relative %: 61 % (ref 43–77)
Platelets: 338 10*3/uL (ref 150–400)

## 2013-08-04 NOTE — Telephone Encounter (Signed)
Pt has an appt for tomorrow.Ana Warren

## 2013-08-05 ENCOUNTER — Ambulatory Visit (INDEPENDENT_AMBULATORY_CARE_PROVIDER_SITE_OTHER): Payer: Managed Care, Other (non HMO)

## 2013-08-05 ENCOUNTER — Ambulatory Visit: Payer: Commercial Indemnity

## 2013-08-05 DIAGNOSIS — Z1231 Encounter for screening mammogram for malignant neoplasm of breast: Secondary | ICD-10-CM

## 2013-08-05 DIAGNOSIS — R1011 Right upper quadrant pain: Secondary | ICD-10-CM

## 2013-08-05 DIAGNOSIS — K7689 Other specified diseases of liver: Secondary | ICD-10-CM

## 2013-08-06 ENCOUNTER — Other Ambulatory Visit: Payer: Self-pay | Admitting: Family Medicine

## 2013-08-06 DIAGNOSIS — K769 Liver disease, unspecified: Secondary | ICD-10-CM

## 2013-08-07 ENCOUNTER — Telehealth: Payer: Self-pay | Admitting: *Deleted

## 2013-08-07 NOTE — Telephone Encounter (Signed)
No prior auth needed per [facility] for MRI abd w/wo.

## 2013-08-09 ENCOUNTER — Ambulatory Visit (HOSPITAL_BASED_OUTPATIENT_CLINIC_OR_DEPARTMENT_OTHER)
Admission: RE | Admit: 2013-08-09 | Discharge: 2013-08-09 | Disposition: A | Discharge status: Home / Self Care | Payer: Managed Care, Other (non HMO) | Source: Ambulatory Visit | Attending: Family Medicine | Admitting: Family Medicine

## 2013-08-09 DIAGNOSIS — D1803 Hemangioma of intra-abdominal structures: Secondary | ICD-10-CM | POA: Insufficient documentation

## 2013-08-09 DIAGNOSIS — K769 Liver disease, unspecified: Secondary | ICD-10-CM

## 2013-08-09 DIAGNOSIS — Z9089 Acquired absence of other organs: Secondary | ICD-10-CM | POA: Insufficient documentation

## 2013-08-09 MED ORDER — GADOBENATE DIMEGLUMINE 529 MG/ML IV SOLN
14.0000 mL | Freq: Once | INTRAVENOUS | Status: AC | PRN
  Administered 2013-08-09: 14 mL via INTRAVENOUS

## 2013-08-28 ENCOUNTER — Telehealth: Payer: Self-pay | Admitting: *Deleted

## 2013-08-28 NOTE — Telephone Encounter (Signed)
Needs appt.  I have an appointment open at 4:15 today or she can see one of the other providers if she would like, or consider urgent care next door

## 2013-08-28 NOTE — Telephone Encounter (Signed)
Pt calls stating that she has a rash on the left side of her face.  She is asking if you can send in some prednisone or something for it.  She has a reunion this weekend & would like for it to be cleared before then.  Please advise

## 2013-08-28 NOTE — Telephone Encounter (Signed)
Pt notified & states that she'll go to a fast med or urgent care.

## 2017-02-05 ENCOUNTER — Ambulatory Visit (INDEPENDENT_AMBULATORY_CARE_PROVIDER_SITE_OTHER): Payer: Self-pay

## 2017-02-05 ENCOUNTER — Encounter: Payer: Self-pay | Admitting: Family Medicine

## 2017-02-05 ENCOUNTER — Ambulatory Visit (INDEPENDENT_AMBULATORY_CARE_PROVIDER_SITE_OTHER): Payer: Self-pay | Admitting: Family Medicine

## 2017-02-05 VITALS — BP 137/76 | HR 70 | Ht 64.0 in | Wt 173.0 lb

## 2017-02-05 DIAGNOSIS — M25542 Pain in joints of left hand: Principal | ICD-10-CM

## 2017-02-05 DIAGNOSIS — Z1231 Encounter for screening mammogram for malignant neoplasm of breast: Secondary | ICD-10-CM

## 2017-02-05 DIAGNOSIS — R3915 Urgency of urination: Secondary | ICD-10-CM

## 2017-02-05 DIAGNOSIS — R252 Cramp and spasm: Secondary | ICD-10-CM

## 2017-02-05 DIAGNOSIS — M2540 Effusion, unspecified joint: Secondary | ICD-10-CM

## 2017-02-05 DIAGNOSIS — R8299 Other abnormal findings in urine: Secondary | ICD-10-CM

## 2017-02-05 DIAGNOSIS — R82998 Other abnormal findings in urine: Secondary | ICD-10-CM

## 2017-02-05 DIAGNOSIS — M79641 Pain in right hand: Secondary | ICD-10-CM

## 2017-02-05 DIAGNOSIS — M25541 Pain in joints of right hand: Secondary | ICD-10-CM

## 2017-02-05 DIAGNOSIS — M79642 Pain in left hand: Secondary | ICD-10-CM

## 2017-02-05 LAB — COMPLETE METABOLIC PANEL WITH GFR
ALT: 19 U/L (ref 6–29)
AST: 16 U/L (ref 10–35)
Albumin: 4.4 g/dL (ref 3.6–5.1)
Alkaline Phosphatase: 72 U/L (ref 33–130)
BILIRUBIN TOTAL: 0.7 mg/dL (ref 0.2–1.2)
BUN: 13 mg/dL (ref 7–25)
CO2: 26 mmol/L (ref 20–31)
Calcium: 9.3 mg/dL (ref 8.6–10.4)
Chloride: 107 mmol/L (ref 98–110)
Creat: 0.78 mg/dL (ref 0.50–1.05)
GFR, EST NON AFRICAN AMERICAN: 85 mL/min (ref >=60)
GFR, Est African American: >89 mL/min (ref >=60)
GLUCOSE: 95 mg/dL (ref 65–99)
Potassium: 4.3 mmol/L (ref 3.5–5.3)
SODIUM: 140 mmol/L (ref 135–146)
TOTAL PROTEIN: 6.8 g/dL (ref 6.1–8.1)

## 2017-02-05 LAB — POCT URINALYSIS DIPSTICK
Bilirubin, UA: NEGATIVE
Glucose, UA: NEGATIVE
Ketones, UA: NEGATIVE
NITRITE UA: NEGATIVE
PH UA: 5.5 (ref 5.0–8.0)
Protein, UA: NEGATIVE
Spec Grav, UA: <1.003 — AB (ref 1.030–1.035)
UROBILINOGEN UA: 0.2 (ref ?–2.0)

## 2017-02-05 LAB — CBC WITH DIFFERENTIAL/PLATELET
BASOS PCT: 0 %
Basophils Absolute: 0 cells/uL (ref 0–200)
EOS ABS: 292 {cells}/uL (ref 15–500)
Eosinophils Relative: 4 %
HEMATOCRIT: 41.9 % (ref 35.0–45.0)
HEMOGLOBIN: 13.9 g/dL (ref 11.7–15.5)
Lymphocytes Relative: 27 %
Lymphs Abs: 1971 cells/uL (ref 850–3900)
MCH: 30.1 pg (ref 27.0–33.0)
MCHC: 33.2 g/dL (ref 32.0–36.0)
MCV: 90.7 fL (ref 80.0–100.0)
MONO ABS: 511 {cells}/uL (ref 200–950)
MONOS PCT: 7 %
MPV: 10 fL (ref 7.5–12.5)
NEUTROS ABS: 4526 {cells}/uL (ref 1500–7800)
Neutrophils Relative %: 62 %
PLATELETS: 301 10*3/uL (ref 140–400)
RBC: 4.62 MIL/uL (ref 3.80–5.10)
RDW: 13.7 % (ref 11.0–15.0)
WBC: 7.3 10*3/uL (ref 3.8–10.8)

## 2017-02-05 NOTE — Progress Notes (Signed)
Subjective:    Patient ID: Ana Warren, female    DOB: 05-29-1961, 56 y.o.   MRN: [id number]  HPI 56 year old female comes in today complaining of joint pain. She denies any family history of maternal arthritis or other similar autoimmune diseases. Having painful flares in her hands and toes for several months.  No pain in wrists or ankles.  Says also having some bilat knee pain. She says she does occasionally notice some swelling. She feels like the DIPs and her thumbs are most affected. She also complains of some occasional cramping in her toes. She does report that she had a maternal great aunt and possibly her maternal grandmother with a diagnosis of rheumatoid arthritis.  She also complains of left low back pain that occasionally radiates downward. She's not taking any medications for medication will apply heat. She is not exercising regularly.  Urinary sxs for several months. His been having some left flank pain which she relates to her back but says it could certainly be her kidney. She's also having some urinary urgency and occasionally a little bit of pelvic pressure after urination. No fevers chills or sweats. No blood in her urine. She says the symptoms come and go.  Review of Systems  BP 137/76   Pulse 70   Ht 5\' 4"  (1.626 m)   Wt 173 lb (78.5 kg)   SpO2 100%   BMI 29.70 kg/m     Allergies  Allergen Reactions  . Sulfamethoxazole-Trimethoprim Shortness Of Breath and Palpitations    Past Medical History:  Diagnosis Date  . Hx of tonsillectomy   . MVP (mitral valve prolapse)   . Vaginosis    recurrent    Past Surgical History:  Procedure Laterality Date  . CHOLECYSTECTOMY  10/08   lap  . TONSILLECTOMY      Social History   Social History  . Marital status: Married    Spouse name: N/A  . Number of children: N/A  . Years of education: N/A   Occupational History  . Not on file.   Social History Main Topics  . Smoking status: Never Smoker  . Smokeless  tobacco: Never Used  . Alcohol use No  . Drug use: No  . Sexual activity: Not on file   Other Topics Concern  . Not on file   Social History Narrative  . No narrative on file    Family History  Problem Relation Age of Onset  . Parkinsonism Father 91  . Cancer Paternal Aunt     post menopausal breast CA    Outpatient Encounter Prescriptions as of 02/05/2017  Medication Sig  . Multiple Vitamin (MULTIVITAMIN) tablet Take 1 tablet by mouth daily.   No facility-administered encounter medications on file as of 02/05/2017.          Objective:   Physical Exam  Constitutional: She is oriented to person, place, and time. She appears well-developed and well-nourished.  HENT:  Head: Normocephalic and atraumatic.  Cardiovascular: Normal rate, regular rhythm and normal heart sounds.   Pulmonary/Chest: Effort normal and breath sounds normal.  Musculoskeletal:  She does have some early deformity of the DIPs on several fingers. On her right thumb at the DIP she has a nodule forming. Mildly tender on exam. No distinct swelling or erythema of the joints.  Neurological: She is alert and oriented to person, place, and time.  Skin: Skin is warm and dry.  Psychiatric: She has a normal mood and affect. Her behavior is normal.  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 Main treatment would be exercises and Aleve or Tylenol.  Foot cramping-she has been walking barefoot most days that she helps take care of her grandchildren. We discussed wearing more supportive shoe wear during the day and seeing if this improves. Can also be caused by electrolyte imbalance.  Urinary urgency-we'll do a urinalysis today. Urinalysis just showed some trace leukocytes. We'll send for culture.  Did encourage her to schedule her Pap smear and mammogram.  Reminded her that she is due for her tetanus vaccine  encouraged her to think about it.

## 2017-02-06 LAB — SEDIMENTATION RATE: SED RATE: 4 mm/h (ref 0–30)

## 2017-02-06 LAB — C-REACTIVE PROTEIN: CRP: 2.3 mg/L (ref <8.0)

## 2017-02-06 LAB — CYCLIC CITRUL PEPTIDE ANTIBODY, IGG

## 2017-02-08 LAB — URINE CULTURE

## 2017-02-08 MED ORDER — CIPROFLOXACIN HCL 500 MG PO TABS: 500.0000 mg | ORAL_TABLET | Freq: Two times a day (BID) | ORAL | 0 refills | Status: AC

## 2017-02-08 NOTE — Addendum Note (Signed)
Addended by: Beatrice Lecher D on: 02/08/2017 04:42 PM   Modules accepted: Orders

## 2017-02-28 ENCOUNTER — Ambulatory Visit: Payer: Self-pay

## 2017-05-08 ENCOUNTER — Encounter: Payer: Managed Care, Other (non HMO) | Admitting: Physician Assistant

## 2017-05-15 ENCOUNTER — Encounter: Payer: Self-pay | Admitting: Family Medicine

## 2017-05-15 ENCOUNTER — Ambulatory Visit (INDEPENDENT_AMBULATORY_CARE_PROVIDER_SITE_OTHER): Payer: Self-pay | Admitting: Family Medicine

## 2017-05-15 ENCOUNTER — Other Ambulatory Visit (HOSPITAL_COMMUNITY)
Admission: RE | Admit: 2017-05-15 | Discharge: 2017-05-15 | Disposition: A | Discharge status: Home / Self Care | Payer: Self-pay | Source: Ambulatory Visit | Attending: Family Medicine | Admitting: Family Medicine

## 2017-05-15 VITALS — BP 135/78 | HR 76 | Ht 64.0 in | Wt 169.0 lb

## 2017-05-15 DIAGNOSIS — Z23 Encounter for immunization: Secondary | ICD-10-CM

## 2017-05-15 DIAGNOSIS — Z1159 Encounter for screening for other viral diseases: Secondary | ICD-10-CM

## 2017-05-15 DIAGNOSIS — Z01419 Encounter for gynecological examination (general) (routine) without abnormal findings: Secondary | ICD-10-CM

## 2017-05-15 DIAGNOSIS — Z0000 Encounter for general adult medical examination without abnormal findings: Secondary | ICD-10-CM

## 2017-05-15 NOTE — Patient Instructions (Addendum)
Keep up a regular exercise program and make sure you are eating a healthy diet Try to eat 4 servings of dairy a day, or if you are lactose intolerant take a calcium with vitamin D daily.  Your vaccines are up to date.  Reschedule her mammogram.

## 2017-05-15 NOTE — Progress Notes (Signed)
Subjective:     Ana Warren is a 56 y.o. female and is here for a comprehensive physical exam. The patient reports no problems.  Social History   Social History  . Marital status: Married    Spouse name: N/A  . Number of children: N/A  . Years of education: N/A   Occupational History  . Not on file.   Social History Main Topics  . Smoking status: Never Smoker  . Smokeless tobacco: Never Used  . Alcohol use No  . Drug use: No  . Sexual activity: Not on file   Other Topics Concern  . Not on file   Social History Narrative  . No narrative on file   Health Maintenance  Topic Date Due  . Hepatitis C Screening  [birth year]  . HIV Screening  02/20/1975  . COLONOSCOPY  02/19/2010  . PAP SMEAR  12/30/2013  . MAMMOGRAM  08/06/2015  . INFLUENZA VACCINE  02/05/2018 (Originally 06/20/2017)  . TETANUS/TDAP  05/16/2027    The following portions of the patient's history were reviewed and updated as appropriate: allergies, current medications, past family history, past medical history, past social history, past surgical history and problem list.  Review of Systems A comprehensive review of systems was negative.   Objective:    BP 135/78   Pulse 76   Ht 5\' 4"  (1.626 m)   Wt 169 lb (76.7 kg)   BMI 29.01 kg/m  General appearance: alert, cooperative and appears stated age Head: Normocephalic, without obvious abnormality, atraumatic Eyes: conj clear, EOMI, pEERLA Ears: normal TM's and external ear canals both ears Nose: Nares normal. Septum midline. Mucosa normal. No drainage or sinus tenderness. Throat: lips, mucosa, and tongue normal; teeth and gums normal Neck: no adenopathy, no carotid bruit, no JVD, supple, symmetrical, trachea midline and thyroid not enlarged, symmetric, no tenderness/mass/nodules Back: symmetric, no curvature. ROM normal. No CVA tenderness. Lungs: clear to auscultation bilaterally Breasts: normal appearance, no masses or tenderness Heart: regular rate  and rhythm, S1, S2 normal, no murmur, click, rub or gallop Abdomen: soft, non-tender; bowel sounds normal; no masses,  no organomegaly Pelvic: cervix normal in appearance, external genitalia normal, no adnexal masses or tenderness, no cervical motion tenderness, rectovaginal septum normal, uterus normal size, shape, and consistency and vagina normal without discharge Extremities: extremities normal, atraumatic, no cyanosis or edema Pulses: 2+ and symmetric Skin: Skin color, texture, turgor normal. No rashes or lesions Lymph nodes: Cervical, supraclavicular, and axillary nodes normal. Neurologic: Alert and oriented X 3, normal strength and tone. Normal symmetric reflexes. Normal coordination and gait    Assessment:    Healthy female exam.      Plan:     See After Visit Summary for Counseling Recommendations   Keep up a regular exercise program and make sure you are eating a healthy diet Try to eat 4 servings of dairy a day, or if you are lactose intolerant take a calcium with vitamin D daily.  Discussion was vaccine. Handout given. Pap smear performed today. Will call with results once available. Encouraged her to reschedule her mammogram. Discussed discussed Cologuard as an option for colon cancer screening. Hepatitis C screening recommended and ordered. T that vaccine given today.

## 2017-05-16 LAB — CYTOLOGY - PAP
Diagnosis: NEGATIVE
HPV: NOT DETECTED

## 2017-05-16 NOTE — Progress Notes (Signed)
Call patient: Your Pap smear is normal. Repeat in 5 years.

## 2017-05-24 ENCOUNTER — Ambulatory Visit (INDEPENDENT_AMBULATORY_CARE_PROVIDER_SITE_OTHER): Payer: Self-pay | Admitting: Family Medicine

## 2017-05-24 DIAGNOSIS — L989 Disorder of the skin and subcutaneous tissue, unspecified: Secondary | ICD-10-CM

## 2017-05-24 NOTE — Patient Instructions (Signed)
OK to remove bandage in the AM. OK to shower. Avoid scrubbing at lesion.   Apply vaseline daily for 2 week.  Can keep covered for 2 days.

## 2017-05-24 NOTE — Progress Notes (Signed)
   Subjective:    Patient ID: Ana Warren, female    DOB: 04-19-1961, 56 y.o.   MRN: [id number]  HPI 56 year old female is here today to discuss biopsy on a skin lesion on her left shoulder. She says she noticed it about 8 weeks ago if not longer. She says it really doesn't burn is much. Occasionally it will bleed specific irritated on her clothing. It does seem to have a scale to it. She says sometimes it almost seems to fade like it's, go away but then it comes right back.   Review of Systems     Objective:   Physical Exam  Constitutional: She is oriented to person, place, and time. She appears well-developed and well-nourished.  HENT:  Head: Normocephalic and atraumatic.  Eyes: Conjunctivae and EOM are normal.  Cardiovascular: Normal rate.   Pulmonary/Chest: Effort normal.  Neurological: She is alert and oriented to person, place, and time.  Skin: Skin is dry. No pallor.  On her left upper outer shoulder she has a 5 x 7 mm erythematous scaly lesion. No open wound or drainage.  Psychiatric: She has a normal mood and affect. Her behavior is normal.  Vitals reviewed.      Assessment & Plan:  Skin lesion-suspicious for either squamous cell carcinoma or irritated actinic keratosis. Recommend shave biopsy for further evaluation. Shave biopsy performed. Patient tolerated well. Follow-up wound care discussed.  Shave Biopsy Procedure Note  Pre-operative Diagnosis: Suspicious lesion  Post-operative Diagnosis: same, Concerning for irritated actinic keratosis varus is squamous cell carcinoma.  Locations:left Upper outer shoulder  Indications: not healing, present > 8 weeks.   Anesthesia: bupivicaine 0.5% with epid without added sodium bicarbonate  Procedure Details  Patient informed of the risks (including bleeding and infection) and benefits of the  procedure and Verbal informed consent obtained.  The lesion and surrounding area were given a sterile prep using chlorhexidine  and draped in the usual sterile fashion. A scalpel was used to shave an area of skin approximately 1cm by 1cm.  Hemostasis achieved with alumuninum chloride. Antibiotic ointment and a sterile dressing applied.  The specimen was sent for pathologic examination. The patient tolerated the procedure well.  EBL: trace  Findings: awaith pathology  Condition: Stable  Complications: none.  Plan: 1. Instructed to keep the wound dry and covered for 24-48h and clean thereafter. 2. Warning signs of infection were reviewed.   3. Recommended that the patient use OTC acetaminophen as needed for pain.  4. Return PRN.

## 2017-05-28 ENCOUNTER — Other Ambulatory Visit: Payer: Self-pay

## 2017-05-28 DIAGNOSIS — C4491 Basal cell carcinoma of skin, unspecified: Secondary | ICD-10-CM

## 2017-06-06 ENCOUNTER — Telehealth: Payer: Self-pay

## 2017-06-06 DIAGNOSIS — C4491 Basal cell carcinoma of skin, unspecified: Secondary | ICD-10-CM

## 2017-06-06 NOTE — Telephone Encounter (Signed)
Pt called stating she would like to get her referral to dermatology changed to [facility]. Please assist.     [facility] [address], [address]  [place], [place] [address]  [phone]

## 2017-06-07 NOTE — Telephone Encounter (Signed)
OK to change referral

## 2017-06-07 NOTE — Telephone Encounter (Signed)
New referral placed.

## 2018-02-06 ENCOUNTER — Ambulatory Visit: Payer: Self-pay | Admitting: Family Medicine

## 2018-03-24 ENCOUNTER — Emergency Department
Admission: EM | Admit: 2018-03-24 | Discharge: 2018-03-24 | Disposition: A | Discharge status: Home / Self Care | Payer: Self-pay | Source: Home / Self Care

## 2018-03-24 ENCOUNTER — Other Ambulatory Visit: Payer: Self-pay

## 2018-03-24 DIAGNOSIS — J029 Acute pharyngitis, unspecified: Secondary | ICD-10-CM

## 2018-03-24 DIAGNOSIS — R509 Fever, unspecified: Secondary | ICD-10-CM

## 2018-03-24 DIAGNOSIS — J069 Acute upper respiratory infection, unspecified: Secondary | ICD-10-CM

## 2018-03-24 MED ORDER — BENZONATATE 100 MG PO CAPS: 100.0000 mg | ORAL_CAPSULE | Freq: Three times a day (TID) | ORAL | 0 refills | Status: DC | PRN

## 2018-03-24 MED ORDER — AZITHROMYCIN 250 MG PO TABS: ORAL_TABLET | ORAL | 0 refills | Status: DC

## 2018-03-24 NOTE — ED Provider Notes (Signed)
[facility]    CSN: [id number] Arrival date & time: 03/24/18  1116   History   Chief Complaint Chief Complaint  Patient presents with  . Cough  . Fever   HPI Ana Warren is a 57 y.o. female.  Presents for a evaluation of a worsening cough, chest congestion, and low-grade fever x4 days.  Patient reports that she has had a gradually worsening nonproductive cough with chest tightness.  Complains of associated headache and sore throat.  She has been taking ibuprofen over the course of the last 4 days and continues to have a subjective fever.  She denies body aches, shortness of breath, wheezing, nasal congestion or drainage.  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 Mostly concerned about cough. She is a non-smoker and has no prior history asthma or bronchitis.   Past Medical History:  Diagnosis Date  . Hx of tonsillectomy   . MVP (mitral valve prolapse)   . Vaginosis    recurrent    Patient Active Problem List   Diagnosis Date Noted  . BLADDER PROLAPSE 12/29/2010  . POSTMENOPAUSAL STATUS 12/29/2010  . HOT FLASHES 03/22/2009  . UNSPECIFIED PRURITIC DISORDER 09/15/2008  . GALLSTONES 07/17/2007  . DYSPEPSIA 05/17/2007  . HEMATURIA 05/17/2007  . IRREGULAR MENSES 05/17/2007  . PELVIC  PAIN 05/01/2007    Past Surgical History:  Procedure Laterality Date  . CHOLECYSTECTOMY  10/08   lap  . TONSILLECTOMY      OB History   None      Home Medications    Prior to Admission medications   Medication Sig Start Date End Date Taking? Authorizing Provider  Multiple Vitamin (MULTIVITAMIN) tablet Take 1 tablet by mouth daily.    [provider]    Family History Family History  Problem Relation Age of Onset  . Parkinsonism Father 67  . Cancer Paternal Aunt        post menopausal breast CA    Social History Social History   Tobacco Use  . Smoking status: Never Smoker  . Smokeless tobacco:  Never Used  Substance Use Topics  . Alcohol use: No  . Drug use: No     Allergies   Sulfamethoxazole-trimethoprim   Review of Systems Review of Systems  Constitutional: Negative for activity change, appetite change, diaphoresis, fever and unexpected weight change.  HENT: Negative.   Eyes: Negative.   Respiratory: Positive for cough and chest tightness.   Cardiovascular: Negative.   Gastrointestinal: Negative.   Genitourinary: Negative.   Musculoskeletal: Negative.   Allergic/Immunologic: Negative.   Neurological: Positive for headaches.  hysical Exam Triage Vital Signs ED Triage Vitals  Enc Vitals Group     BP 03/24/18 1143 132/84     Pulse Rate 03/24/18 1143 93     Resp 03/24/18 1143 18     Temp 03/24/18 1143 99.2 F (37.3 C)     Temp Source 03/24/18 1143 Oral     SpO2 03/24/18 1143 94 %     Weight 03/24/18 1144 176 lb (79.8 kg)     Height 03/24/18 1144 5\' 3"  (1.6 m)     Head Circumference --      Peak Flow --      Pain Score 03/24/18 1144 0     Pain Loc --      Pain Edu? --      Excl. in [place]? --    No data found.  Updated Vital  Signs BP 132/84 (BP Location: Right Arm)   Pulse 93   Temp 99.2 F (37.3 C) (Oral)   Resp 18   Ht 5\' 3"  (1.6 m)   Wt 176 lb (79.8 kg)   SpO2 94%   BMI 31.18 kg/m   Visual Acuity Right Eye Distance:   Left Eye Distance:   Bilateral Distance:    Right Eye Near:   Left Eye Near:    Bilateral Near:     Physical Exam  Constitutional: She is oriented to person, place, and time. She appears well-developed and well-nourished.  Non-toxic appearance. She does not have a sickly appearance. She does not appear ill.  Well-appearing, female  HENT:  Head: Normocephalic and atraumatic.  Right Ear: External ear normal.  Left Ear: External ear normal.  Nose: Nose normal.  Mouth/Throat: Uvula is midline. Posterior oropharyngeal edema and posterior oropharyngeal erythema present. No oropharyngeal exudate.  Eyes: Pupils are equal, round,  and reactive to light. EOM are normal.  Neck: Normal range of motion. Neck supple.  Cardiovascular: Normal rate, regular rhythm, normal heart sounds and intact distal pulses.  Pulmonary/Chest: Effort normal and breath sounds normal. She has no wheezes. She has no rales. She exhibits no tenderness.  Lymphadenopathy:    She has no cervical adenopathy.  Neurological: She is alert and oriented to person, place, and time.  Skin: Skin is warm and dry.  Psychiatric: She has a normal mood and affect. Her behavior is normal. Judgment and thought content normal.   UC Treatments / Results  Labs (all labs ordered are listed, but only abnormal results are displayed) Labs Reviewed - No data to display  EKG None  Radiology No results found.  Procedures Procedures (including critical care time)  Medications Ordered in UC Medications - No data to display  Initial Impression / Assessment and Plan / UC Course  I have reviewed the triage vital signs and the nursing notes.  Pertinent labs & imaging results that were available during my care of the patient were reviewed by me and considered in my medical decision making (see chart for details).  Well-appearing 57 year old female presents today with a 4-day history of a persistent dry cough and some chest congestion.  Respiratory exam was unremarkable.  However given patient has a persistent low-grade fever and has exposure to young children daily and given her age I will treat her for URI with a broad-spectrum antibiotic along with benzonatate for cough.  Patient given strict return instructions if symptoms do not resolve or improve within the next 72 hours.      Final Clinical Impressions(s) / UC Diagnoses   Final diagnoses:  Viral upper respiratory tract infection  Low grade fever  Sore throat   Discharge Instructions   None    ED Prescriptions    None     Controlled Substance Prescriptions [place] Controlled Substance Registry consulted?  Not Applicable   Scot Jun, [place] 03/24/18 1253

## 2018-03-24 NOTE — ED Triage Notes (Signed)
Pt c/o dry cough and fever since Thursday. Clear nasal drainage. Also head and sinus pressure. Took advil and mucinex at 8:30 this morning.

## 2018-07-23 ENCOUNTER — Encounter: Payer: Self-pay | Admitting: Family Medicine

## 2018-07-23 ENCOUNTER — Ambulatory Visit (INDEPENDENT_AMBULATORY_CARE_PROVIDER_SITE_OTHER): Payer: Self-pay | Admitting: Family Medicine

## 2018-07-23 VITALS — BP 144/83 | HR 71 | Ht 64.0 in | Wt 173.0 lb

## 2018-07-23 DIAGNOSIS — N611 Abscess of the breast and nipple: Secondary | ICD-10-CM

## 2018-07-23 MED ORDER — DOXYCYCLINE HYCLATE 100 MG PO TABS: 100.0000 mg | ORAL_TABLET | Freq: Two times a day (BID) | ORAL | 0 refills | Status: DC

## 2018-07-23 NOTE — Progress Notes (Signed)
Subjective:    Patient ID: Ana Warren, female    DOB: 01/06/1961, 57 y.o.   MRN: [id number]  HPI 57 year old female comes in today complaining of drainage underneath her left breast.  She has a history of cystic acne but in the last several years she is been getting some cysts more so on her torso and buttock area.  Last Wednesday she noticed one under her left breast.  Initially it looked more like a pimple and it drained.  Then it kind of opened up a second time and drained again but now she has a white spot that looks like it is not healing and it continues to issues with a little bit of blood and sometimes some pus.  She denies any foul odor.  No fevers chills or sweats.   Review of Systems  BP (!) 144/83   Pulse 71   Ht 5\' 4"  (1.626 m)   Wt 173 lb (78.5 kg)   BMI 29.70 kg/m     Allergies  Allergen Reactions  . Sulfamethoxazole-Trimethoprim Shortness Of Breath and Palpitations    Past Medical History:  Diagnosis Date  . Hx of tonsillectomy   . MVP (mitral valve prolapse)   . Vaginosis    recurrent    Past Surgical History:  Procedure Laterality Date  . CHOLECYSTECTOMY  10/08   lap  . TONSILLECTOMY      Social History   Socioeconomic History  . Marital status: Married    Spouse name: Not on file  . Number of children: Not on file  . Years of education: Not on file  . Highest education level: Not on file  Occupational History  . Not on file  Social Needs  . Financial resource strain: Not on file  . Food insecurity:    Worry: Not on file    Inability: Not on file  . Transportation needs:    Medical: Not on file    Non-medical: Not on file  Tobacco Use  . Smoking status: Never Smoker  . Smokeless tobacco: Never Used  Substance and Sexual Activity  . Alcohol use: No  . Drug use: No  . Sexual activity: Not on file  Lifestyle  . Physical activity:    Days per week: Not on file    Minutes per session: Not on file  . Stress: Not on file   Relationships  . Social connections:    Talks on phone: Not on file    Gets together: Not on file    Attends religious service: Not on file    Active member of club or organization: Not on file    Attends meetings of clubs or organizations: Not on file    Relationship status: Not on file  . Intimate partner violence:    Fear of current or ex partner: Not on file    Emotionally abused: Not on file    Physically abused: Not on file    Forced sexual activity: Not on file  Other Topics Concern  . Not on file  Social History Narrative  . Not on file    Family History  Problem Relation Age of Onset  . Parkinsonism Father 59  . Cancer Paternal Aunt        post menopausal breast CA    Outpatient Encounter Medications as of 07/23/2018  Medication Sig  . doxycycline (VIBRA-TABS) 100 MG tablet Take 1 tablet (100 mg total) by mouth 2 (two) times daily.  . [DISCONTINUED] azithromycin (ZITHROMAX)  250 MG tablet Take 2 tabs PO x 1 dose, then 1 tab PO QD x 4 days  . [DISCONTINUED] benzonatate (TESSALON) 100 MG capsule Take 1-2 capsules (100-200 mg total) by mouth 3 (three) times daily as needed for cough.  . [DISCONTINUED] Multiple Vitamin (MULTIVITAMIN) tablet Take 1 tablet by mouth daily.   No facility-administered encounter medications on file as of 07/23/2018.           Objective:   Physical Exam  Constitutional: She is oriented to person, place, and time. She appears well-developed and well-nourished.  HENT:  Head: Normocephalic and atraumatic.  Eyes: Conjunctivae and EOM are normal.  Cardiovascular: Normal rate.  Pulmonary/Chest: Effort normal.  Neurological: She is alert and oriented to person, place, and time.  Skin: Skin is dry. No pallor.  Underneath her left breast she has an ulcerated area oval-shaped approximately 16 x 9 mm in size with underlying fat exposed.  The area is very indurated with significant erythema spreading 3 to 4 cm from the edge of the wound.  The wound  appears moist but no active drainage on exam.  Psychiatric: She has a normal mood and affect. Her behavior is normal.  Vitals reviewed.       Assessment & Plan:  Abscess of the ulceration underneath the left breast-discussed treatment options.  Will start with oral doxycycline to cover for staph.  Measure the wound in case it is not healing well.  I do not see her back in 1 week if not healing. If at any point she feels like she is getting worse or develops fevers chills or sweats please let us know immediately.  Place Xerform on the wound with non-stick gauze and then no bandage at night.  Given some guaze and xeroform to take home.

## 2018-08-15 ENCOUNTER — Telehealth: Payer: Self-pay | Admitting: Family Medicine

## 2018-08-15 MED ORDER — DOXYCYCLINE HYCLATE 100 MG PO TABS: 100.0000 mg | ORAL_TABLET | Freq: Two times a day (BID) | ORAL | 0 refills | Status: DC

## 2018-08-15 NOTE — Telephone Encounter (Signed)
Ana Warren called and stated she seen Dr Jerilynn Mages on 07/23/18 and was put on an antibiotic.  She said she wanted to ask you some questions about it and would like for you to give her a call at [phone]. - CF

## 2018-08-15 NOTE — Telephone Encounter (Signed)
Pt aware. She will pick up the doxycyline and the Hibiclens today. Thank you

## 2018-08-15 NOTE — Telephone Encounter (Signed)
Called pt back and she informed me that she has developed another abscess. She said that she finished the entire course of ABX that she was given when she was seen the last time for this. She informed me that this abscess is a little bit lower than the previous one. And it came up on Tuesday of this week (2 days ago), she said that it is not as angry as the 1st one but it is red, it is about 1/2 -1 inch in size. She basically wanted to know if Dr. Madilyn Fireman would call another round of ABX in for her or does she need to come in to be seen for this. I told her that I would fwd this to her and get back w/her with Dr. Gardiner Ramus recommendations.Marland KitchenMarland KitchenAudelia Hives [place]

## 2018-08-15 NOTE — Telephone Encounter (Signed)
Okay, I did send in another round of doxycycline.  She may also want to get a surgical cleansing soap that can be found at the pharmacy called Hibiclens.  I want her to scrub/wash her body with that and underneath her fingernails really well twice a week for the next month.  Do not use on her and scalp

## 2018-09-19 ENCOUNTER — Ambulatory Visit: Payer: Self-pay | Admitting: Family Medicine

## 2018-10-08 ENCOUNTER — Ambulatory Visit (INDEPENDENT_AMBULATORY_CARE_PROVIDER_SITE_OTHER): Payer: Self-pay | Admitting: Family Medicine

## 2018-10-08 ENCOUNTER — Encounter: Payer: Self-pay | Admitting: Family Medicine

## 2018-10-08 VITALS — BP 130/78 | HR 74 | Ht 63.58 in | Wt 174.0 lb

## 2018-10-08 DIAGNOSIS — F439 Reaction to severe stress, unspecified: Secondary | ICD-10-CM

## 2018-10-08 DIAGNOSIS — N3000 Acute cystitis without hematuria: Secondary | ICD-10-CM

## 2018-10-08 DIAGNOSIS — Z23 Encounter for immunization: Secondary | ICD-10-CM

## 2018-10-08 DIAGNOSIS — L709 Acne, unspecified: Secondary | ICD-10-CM

## 2018-10-08 DIAGNOSIS — M2550 Pain in unspecified joint: Secondary | ICD-10-CM

## 2018-10-08 DIAGNOSIS — K121 Other forms of stomatitis: Secondary | ICD-10-CM

## 2018-10-08 DIAGNOSIS — R3915 Urgency of urination: Secondary | ICD-10-CM

## 2018-10-08 DIAGNOSIS — Z1159 Encounter for screening for other viral diseases: Secondary | ICD-10-CM

## 2018-10-08 DIAGNOSIS — Z13220 Encounter for screening for lipoid disorders: Secondary | ICD-10-CM

## 2018-10-08 LAB — POCT URINALYSIS DIPSTICK
BILIRUBIN UA: NEGATIVE
Glucose, UA: NEGATIVE
KETONES UA: NEGATIVE
NITRITE UA: NEGATIVE
PH UA: 5.5 (ref 5.0–8.0)
PROTEIN UA: NEGATIVE
Spec Grav, UA: >=1.03 — AB (ref 1.010–1.025)
UROBILINOGEN UA: 0.2 U/dL

## 2018-10-08 MED ORDER — NITROFURANTOIN MONOHYD MACRO 100 MG PO CAPS: 100.0000 mg | ORAL_CAPSULE | Freq: Two times a day (BID) | ORAL | 0 refills | Status: DC

## 2018-10-08 NOTE — Progress Notes (Addendum)
Subjective:    Patient ID: Ana Warren, female    DOB: 1961/06/23, 57 y.o.   MRN: [id number]  HPI  57 year old female comes in today complaining of acne and mouth lesions.  She says it has cleared up. Thinks may be stress related.  Her husband Nicki Reaper has stage IV colon cancer and after some palliative treatments he opted to no longer continue treatment.  She also takes care of 3 out of her 4 grandchildren daily.  Let me know that after she had lesion on her left breast for which she was evaluated and took the medication she said she got improvement for about a week and then she got another lesion right next to it.  We actually caught in a second round of medication and since then it seems to have cleared up but she says occasionally she will just get these abscesses or cysts on her body.  She said she had cystic acne when she was younger.  Is been doing the Hibiclens scrub once a week and she feels like that has been helping.  She reports episodes of joint pain and tightness.  Particularly in her Her hands, hips and back.  She says it starts to feel tight and stiff.  She has 2 aunts ts on her mother side who have rheumatoid arthritis.  We actually did a arthritis work-up in March 2018 which was negative.  She did have some osteoarthritis based on x-rays.  She does not take anything for her pain or discomfort regularly but will occasionally use an Aleve particularly at night.  She has been having some urgency and frequency for several weeks.  No fevers chills or sweats.  She just has not had a chance to come in and have it evaluated. No blood in the urine.   Review of Systems  BP 130/78   Pulse 74   Ht 5' 3.58" (1.615 m)   Wt 174 lb (78.9 kg)   SpO2 98%   BMI 30.26 kg/m     Allergies  Allergen Reactions  . Sulfamethoxazole-Trimethoprim Shortness Of Breath and Palpitations    Past Medical History:  Diagnosis Date  . Hx of tonsillectomy   . MVP (mitral valve prolapse)   . Vaginosis     recurrent    Past Surgical History:  Procedure Laterality Date  . CHOLECYSTECTOMY  10/08   lap  . TONSILLECTOMY      Social History   Socioeconomic History  . Marital status: Married    Spouse name: Not on file  . Number of children: Not on file  . Years of education: Not on file  . Highest education level: Not on file  Occupational History  . Not on file  Social Needs  . Financial resource strain: Not on file  . Food insecurity:    Worry: Not on file    Inability: Not on file  . Transportation needs:    Medical: Not on file    Non-medical: Not on file  Tobacco Use  . Smoking status: Never Smoker  . Smokeless tobacco: Never Used  Substance and Sexual Activity  . Alcohol use: No  . Drug use: No  . Sexual activity: Not on file  Lifestyle  . Physical activity:    Days per week: Not on file    Minutes per session: Not on file  . Stress: Not on file  Relationships  . Social connections:    Talks on phone: Not on file    Gets  together: Not on file    Attends religious service: Not on file    Active member of club or organization: Not on file    Attends meetings of clubs or organizations: Not on file    Relationship status: Not on file  . Intimate partner violence:    Fear of current or ex partner: Not on file    Emotionally abused: Not on file    Physically abused: Not on file    Forced sexual activity: Not on file  Other Topics Concern  . Not on file  Social History Narrative  . Not on file    Family History  Problem Relation Age of Onset  . Parkinsonism Father 36  . Cancer Paternal Aunt        post menopausal breast CA    Outpatient Encounter Medications as of 10/08/2018  Medication Sig  . nitrofurantoin, macrocrystal-monohydrate, (MACROBID) 100 MG capsule Take 1 capsule (100 mg total) by mouth 2 (two) times daily.  . [DISCONTINUED] doxycycline (VIBRA-TABS) 100 MG tablet Take 1 tablet (100 mg total) by mouth 2 (two) times daily.   No  facility-administered encounter medications on file as of 10/08/2018.          Objective:   Physical Exam  Constitutional: She is oriented to person, place, and time. She appears well-developed and well-nourished.  HENT:  Head: Normocephalic and atraumatic.  Cardiovascular: Normal rate, regular rhythm and normal heart sounds.  Pulmonary/Chest: Effort normal and breath sounds normal.  Neurological: She is alert and oriented to person, place, and time.  Skin: Skin is warm and dry.  Psychiatric: She has a normal mood and affect. Her behavior is normal.        Assessment & Plan:   Situational stress-we discussed options including referral to a therapist or counselor to help her with what she is going through and/or medication.  Initially she thought she would benefit from just a rescue PRN medication but we did talk about potential for chemical dependency and sedation with these medications so she said she wanted to think about it.  We also discussed daily prophylaxis.  She will call back and let me know.   Urinary tract infection based on urinalysis.  Will treat with nitrofurantoin.  If symptoms not resolved then please let us know.  Culture not sent.  Joint pain-certainly could be stress related but also can be a sign of inflammation in the joints.  Recommend a trial of an anti-inflammatory either Aleve or ibuprofen she can take the Aleve twice a day as needed.  And see if this helps control her pain and stiffness.  Cyst/abscesses-continue with weekly Hibiclens scrub.  And at some point if we need to consider putting her on prophylaxis we can.

## 2018-10-09 LAB — LIPID PANEL
Cholesterol: 209 mg/dL — ABNORMAL HIGH (ref <200)
HDL: 46 mg/dL — ABNORMAL LOW (ref >50)
LDL Cholesterol (Calc): 143 mg/dL (calc) — ABNORMAL HIGH
NON-HDL CHOLESTEROL (CALC): 163 mg/dL — AB (ref <130)
TRIGLYCERIDES: 93 mg/dL (ref <150)
Total CHOL/HDL Ratio: 4.5 (calc) (ref <5.0)

## 2018-10-09 LAB — COMPLETE METABOLIC PANEL WITH GFR
AG RATIO: 2 (calc) (ref 1.0–2.5)
ALBUMIN MSPROF: 4.6 g/dL (ref 3.6–5.1)
ALT: 25 U/L (ref 6–29)
AST: 22 U/L (ref 10–35)
Alkaline phosphatase (APISO): 65 U/L (ref 33–130)
BILIRUBIN TOTAL: 0.8 mg/dL (ref 0.2–1.2)
BUN: 12 mg/dL (ref 7–25)
CALCIUM: 9.7 mg/dL (ref 8.6–10.4)
CHLORIDE: 105 mmol/L (ref 98–110)
CO2: 27 mmol/L (ref 20–32)
Creat: 0.93 mg/dL (ref 0.50–1.05)
GFR, EST AFRICAN AMERICAN: 79 mL/min/{1.73_m2} (ref >=60)
GFR, EST NON AFRICAN AMERICAN: 68 mL/min/{1.73_m2} (ref >=60)
GLOBULIN: 2.3 g/dL (ref 1.9–3.7)
Glucose, Bld: 95 mg/dL (ref 65–99)
POTASSIUM: 4.8 mmol/L (ref 3.5–5.3)
Sodium: 141 mmol/L (ref 135–146)
Total Protein: 6.9 g/dL (ref 6.1–8.1)

## 2018-10-09 LAB — CBC
HEMATOCRIT: 42.3 % (ref 35.0–45.0)
HEMOGLOBIN: 14.5 g/dL (ref 11.7–15.5)
MCH: 30.1 pg (ref 27.0–33.0)
MCHC: 34.3 g/dL (ref 32.0–36.0)
MCV: 87.8 fL (ref 80.0–100.0)
MPV: 10.4 fL (ref 7.5–12.5)
Platelets: 348 10*3/uL (ref 140–400)
RBC: 4.82 10*6/uL (ref 3.80–5.10)
RDW: 12.8 % (ref 11.0–15.0)
WBC: 6.7 10*3/uL (ref 3.8–10.8)

## 2018-10-09 LAB — HEPATITIS C ANTIBODY
HEP C AB: NONREACTIVE
SIGNAL TO CUT-OFF: 0.63 (ref <1.00)

## 2018-12-31 ENCOUNTER — Other Ambulatory Visit: Payer: Self-pay | Admitting: Family Medicine

## 2018-12-31 DIAGNOSIS — N3000 Acute cystitis without hematuria: Secondary | ICD-10-CM

## 2019-12-11 ENCOUNTER — Ambulatory Visit: Payer: Self-pay | Admitting: Family Medicine

## 2019-12-24 ENCOUNTER — Telehealth: Payer: Self-pay | Admitting: *Deleted

## 2019-12-24 ENCOUNTER — Ambulatory Visit (INDEPENDENT_AMBULATORY_CARE_PROVIDER_SITE_OTHER): Payer: Self-pay | Admitting: Family Medicine

## 2019-12-24 ENCOUNTER — Encounter: Payer: Self-pay | Admitting: Family Medicine

## 2019-12-24 VITALS — Ht 63.58 in | Wt 174.0 lb

## 2019-12-24 DIAGNOSIS — F430 Acute stress reaction: Secondary | ICD-10-CM

## 2019-12-24 DIAGNOSIS — Z636 Dependent relative needing care at home: Secondary | ICD-10-CM

## 2019-12-24 DIAGNOSIS — R300 Dysuria: Secondary | ICD-10-CM

## 2019-12-24 MED ORDER — CITALOPRAM HYDROBROMIDE 20 MG PO TABS: ORAL_TABLET | ORAL | 1 refills | Status: DC

## 2019-12-24 NOTE — Progress Notes (Signed)
Pt informed me that she has had burning with urination for several months now. She has been trying to drink more water to help with this and avoiding sugary drinks to help with her sxs. 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 He has Colon Cancer that has metastasized to his lungs and adrenal glands. She stated that Hospice is coming in because he has decided to stop treatments about 2 weeks ago.  She would like to discuss possibly starting treatment for her anxiety. She stated that her daughter Jinny Blossom) who is a mutual patient told her to ask about starting on Celexa 20 mg because this is what she takes and it has really helped her.   I did encourage her to speak to her other 2 children about helping her out and also to ask the Hospice nurse about counseling. She was receptive to these recommendations.

## 2019-12-24 NOTE — Telephone Encounter (Signed)
error 

## 2019-12-24 NOTE — Progress Notes (Signed)
Virtual Visit via Video Note  I connected with [place] on 12/24/19 at  9:30 AM EST by a video enabled telemedicine application and verified that I am speaking with the correct person using two identifiers.   I discussed the limitations of evaluation and management by telemedicine and the availability of in person appointments. The patient expressed understanding and agreed to proceed.  Subjective:    CC: dysuria and increased anxiety.   HPI:  Pt informed me that she has had burning with urination for several months now. She has been trying to drink more water to help with this and avoiding sugary drinks to help with her sxs. 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 He has Colon Cancer that has metastasized to his lungs and adrenal glands. She stated that Hospice is coming in because he has decided to stop treatments about 2 weeks ago.  She says the dysuria seems to be coming and going.  Azo does help when it is bothersome.  She admits sometimes she has been holding her urine and not eating regularly.  She has not noticed any blood.  Her bowels are moving normally.  Occasionally she will notice a strong odor but it does not seem to be consistent.  She would like to discuss possibly starting treatment for her anxiety. She stated that her daughter Jinny Blossom) who is a mutual patient told her to ask about starting on Celexa 20 mg because this is what she takes and it has really helped her.   I did encourage her to speak to her other 2 children about helping her out and also to ask the Hospice nurse about counseling. She was receptive to these recommendations.     Past medical history, Surgical history, Family history not pertinant except as noted below, Social history, Allergies, and medications have been entered into the medical record, reviewed, and corrections made.   Review of  Systems: No fevers, chills, night sweats, weight loss, chest pain, or shortness of breath.   Objective:    General: Speaking clearly in complete sentences without any shortness of breath.  Alert and oriented x3.  Normal judgment. No apparent acute distress.    Impression and Recommendations:    Dysuria-we will get a urine sample to try to come by and give get a cup so that we can get a specimen sent for urinalysis and hopefully culture if needed.  There does seem to help which is reassuring.  Anxiety/irritability-has really ramped up especially since her husband's prognosis has worsened.  She is just feeling very overwhelmed at this time.  Hospice will be coming in for their consult tomorrow.  We discussed getting in touch with them about doing some caregiver counseling/therapy.  We also discussed medication as an option.  She is never really taken any antidepressants etc. previously.  That her daughter does take citalopram.  She is willing to consider a trial of the medication so we will start with a half a tab of the 20 mg citalopram and then increase to a whole tab after 8 days.  Plan to follow-up in 3 to 4 weeks via virtual visit to make sure that she is doing well and to adjust dose or change medications if needed.  Caregiver burden-did encourage her to really reach out to her kids and let them help support her.  This is something  she cannot do alone and I am sure that they want to be as helpful as they can during this time.   I discussed the assessment and treatment plan with the patient. The patient was provided an opportunity to ask questions and all were answered. The patient agreed with the plan and demonstrated an understanding of the instructions.   The patient was advised to call back or seek an in-person evaluation if the symptoms worsen or if the condition fails to improve as anticipated.   Beatrice Lecher, MD

## 2019-12-27 LAB — URINALYSIS W MICROSCOPIC + REFLEX CULTURE
Bacteria, UA: NONE SEEN /HPF
Bilirubin Urine: NEGATIVE
Glucose, UA: NEGATIVE
Hgb urine dipstick: NEGATIVE
Hyaline Cast: NONE SEEN /LPF
Ketones, ur: NEGATIVE
Nitrites, Initial: NEGATIVE
Protein, ur: NEGATIVE
RBC / HPF: NONE SEEN /HPF (ref 0–2)
Specific Gravity, Urine: 1.01 (ref 1.001–1.03)
pH: 7 (ref 5.0–8.0)

## 2019-12-27 LAB — URINE CULTURE
MICRO NUMBER:: 10119413
Result:: NO GROWTH
SPECIMEN QUALITY:: ADEQUATE

## 2019-12-27 LAB — CULTURE INDICATED

## 2020-01-01 ENCOUNTER — Ambulatory Visit: Payer: Self-pay | Admitting: Family Medicine

## 2020-07-22 ENCOUNTER — Encounter: Payer: Self-pay | Admitting: Family Medicine

## 2020-07-22 ENCOUNTER — Ambulatory Visit (INDEPENDENT_AMBULATORY_CARE_PROVIDER_SITE_OTHER): Payer: 59 | Admitting: Family Medicine

## 2020-07-22 VITALS — BP 133/70 | HR 68 | Ht 64.0 in | Wt 176.0 lb

## 2020-07-22 DIAGNOSIS — M151 Heberden's nodes (with arthropathy): Secondary | ICD-10-CM | POA: Insufficient documentation

## 2020-07-22 DIAGNOSIS — E785 Hyperlipidemia, unspecified: Secondary | ICD-10-CM

## 2020-07-22 DIAGNOSIS — M2550 Pain in unspecified joint: Secondary | ICD-10-CM

## 2020-07-22 DIAGNOSIS — F430 Acute stress reaction: Secondary | ICD-10-CM | POA: Insufficient documentation

## 2020-07-22 DIAGNOSIS — Z1231 Encounter for screening mammogram for malignant neoplasm of breast: Secondary | ICD-10-CM

## 2020-07-22 DIAGNOSIS — M180 Bilateral primary osteoarthritis of first carpometacarpal joints: Secondary | ICD-10-CM

## 2020-07-22 MED ORDER — CITALOPRAM HYDROBROMIDE 20 MG PO TABS: ORAL_TABLET | ORAL | 1 refills | Status: DC

## 2020-07-22 MED ORDER — MELOXICAM 15 MG PO TABS: 15.0000 mg | ORAL_TABLET | Freq: Every day | ORAL | 3 refills | Status: DC | PRN

## 2020-07-22 NOTE — Progress Notes (Signed)
Acute Office Visit  Subjective:    Patient ID: Ana Warren, female    DOB: Nov 06, 1961, 59 y.o.   MRN: [id number]  Chief Complaint  Patient presents with  . Hand Pain    HPI Patient is in today for pain in hands particularly at the base of the thumb, at the [facility] joints bilaterally, and in her fingers pain in her fingers is mostly in the DIP joints.. She does occ get swelling and throbbing pain at the wrist. Using IBU and topical OTC creams.  Feels like her grip is weak.  She says activities like pulling weeds in the yard tends to aggravate her symptoms and then become more painful.  She also notices more pain with weather change.  Past Medical History:  Diagnosis Date  . Hx of tonsillectomy   . MVP (mitral valve prolapse)   . Vaginosis    recurrent    Past Surgical History:  Procedure Laterality Date  . CHOLECYSTECTOMY  10/08   lap  . TONSILLECTOMY      Family History  Problem Relation Age of Onset  . Parkinsonism Father 52  . Cancer Paternal Aunt        post menopausal breast CA    Social History   Socioeconomic History  . Marital status: Married    Spouse name: Not on file  . Number of children: Not on file  . Years of education: Not on file  . Highest education level: Not on file  Occupational History  . Not on file  Tobacco Use  . Smoking status: Never Smoker  . Smokeless tobacco: Never Used  Vaping Use  . Vaping Use: Never used  Substance and Sexual Activity  . Alcohol use: No  . Drug use: No  . Sexual activity: Not on file  Other Topics Concern  . Not on file  Social History Narrative  . Not on file   Social Determinants of Health   Financial Resource Strain:   . Difficulty of Paying Living Expenses: Not on file  Food Insecurity:   . Worried About Charity fundraiser in the Last Year: Not on file  . Ran Out of Food in the Last Year: Not on file  Transportation Needs:   . Lack of Transportation (Medical): Not on file  . Lack of  Transportation (Non-Medical): Not on file  Physical Activity:   . Days of Exercise per Week: Not on file  . Minutes of Exercise per Session: Not on file  Stress:   . Feeling of Stress : Not on file  Social Connections:   . Frequency of Communication with Friends and Family: Not on file  . Frequency of Social Gatherings with Friends and Family: Not on file  . Attends Religious Services: Not on file  . Active Member of Clubs or Organizations: Not on file  . Attends Archivist Meetings: Not on file  . Marital Status: Not on file  Intimate Partner Violence:   . Fear of Current or Ex-Partner: Not on file  . Emotionally Abused: Not on file  . Physically Abused: Not on file  . Sexually Abused: Not on file    Outpatient Medications Prior to Visit  Medication Sig Dispense Refill  . citalopram (CELEXA) 20 MG tablet 1/2 tab PO QD x 8 days then increase to whole tab daily. 30 tablet 1   No facility-administered medications prior to visit.    Allergies  Allergen Reactions  . Sulfamethoxazole-Trimethoprim Shortness Of Breath and  Palpitations    Review of Systems     Objective:    Physical Exam Constitutional:      Appearance: She is well-developed.  HENT:     Head: Normocephalic and atraumatic.  Cardiovascular:     Rate and Rhythm: Normal rate and regular rhythm.     Heart sounds: Normal heart sounds.  Pulmonary:     Effort: Pulmonary effort is normal.     Breath sounds: Normal breath sounds.  Musculoskeletal:     Comments: She does have some swelling in her right wrist and right CMC joint.  She has some Heberden's nodules and some of the DIP joints particularly in her index and middle finger on her right hand.  She is tender over both CMC joints.  Otherwise normal range of motion of the hands.  No swelling over the PIPs and MCPs.   Skin:    General: Skin is warm and dry.  Neurological:     Mental Status: She is alert and oriented to person, place, and time.   Psychiatric:        Behavior: Behavior normal.     BP 133/70   Pulse 68   Ht 5\' 4"  (1.626 m)   Wt 176 lb (79.8 kg)   SpO2 98%   BMI 30.21 kg/m  Wt Readings from Last 3 Encounters:  07/22/20 176 lb (79.8 kg)  12/24/19 174 lb (78.9 kg)  10/08/18 174 lb (78.9 kg)    Health Maintenance Due  Topic Date Due  . COVID-19 Vaccine (1) Never done  . PAP SMEAR-Modifier  05/15/2020  . INFLUENZA VACCINE  06/20/2020    There are no preventive care reminders to display for this patient.   Lab Results  Component Value Date   TSH 3.812 04/17/2012   Lab Results  Component Value Date   WBC 6.7 10/08/2018   HGB 14.5 10/08/2018   HCT 42.3 10/08/2018   MCV 87.8 10/08/2018   PLT 348 10/08/2018   Lab Results  Component Value Date   NA 141 10/08/2018   K 4.8 10/08/2018   CO2 27 10/08/2018   GLUCOSE 95 10/08/2018   BUN 12 10/08/2018   CREATININE 0.93 10/08/2018   BILITOT 0.8 10/08/2018   ALKPHOS 72 02/05/2017   AST 22 10/08/2018   ALT 25 10/08/2018   PROT 6.9 10/08/2018   ALBUMIN 4.4 02/05/2017   CALCIUM 9.7 10/08/2018   Lab Results  Component Value Date   CHOL 209 (H) 10/08/2018   Lab Results  Component Value Date   HDL 46 (L) 10/08/2018   Lab Results  Component Value Date   LDLCALC 143 (H) 10/08/2018   Lab Results  Component Value Date   TRIG 93 10/08/2018   Lab Results  Component Value Date   CHOLHDL 4.5 10/08/2018   No results found for: HGBA1C     Assessment & Plan:   Problem List Items Addressed This Visit      Musculoskeletal and Integument   Primary osteoarthritis of both first carpometacarpal joints    Discussed treatment options including topical anti-inflammatory gels like Voltaren.  Mostly relying on Tylenol because of its safety profile and occasional use of an anti-inflammatory.  We can do a trial of meloxicam.  Monitor for any GI upset or irritation.  I did warn that chronic use can affect kidney function.  If at some point it continues or  worsens consider injection with Dr. Dianah Field our sports medicine doctor.  She did have a work-up for rheumatoid  about 3 years ago.  Discussed with her that her symptoms right now are still most consistent with osteoarthritis but will repeat lab work.  She does have 2 family members with rheumatoid arthritis and she does have some significant swelling over the right wrist today.      Relevant Medications   meloxicam (MOBIC) 15 MG tablet   Heberden's nodes of both hands     Other   Hyperlipidemia - Primary   Relevant Orders   CBC   COMPLETE METABOLIC PANEL WITH GFR   Lipid panel   ANA   Sedimentation rate   Cyclic citrul peptide antibody, IgG   Uric acid   Arthralgia   Relevant Orders   CBC   ANA   Sedimentation rate   Cyclic citrul peptide antibody, IgG   Uric acid   Acute reaction to stress    She reports that she actually took citalopram for a while specially right around the time that her husband passed away in Feb 11, 2023.  She ran out of it and so discontinued it but would actually like to get back on it she says she tolerated it well and felt like it was effective.  New prescription sent in to restart that medication.  Follow-up in 6 months.      Relevant Medications   citalopram (CELEXA) 20 MG tablet    Other Visit Diagnoses    Screening mammogram, encounter for       Relevant Orders   MM 3D [place] for mammogram. Encouraged her to schedule.    Meds ordered this encounter  Medications  . meloxicam (MOBIC) 15 MG tablet    Sig: Take 1 tablet (15 mg total) by mouth daily as needed for pain.    Dispense:  30 tablet    Refill:  3  . citalopram (CELEXA) 20 MG tablet    Sig: 1/2 tab PO QD x 8 days then increase to whole tab daily.    Dispense:  90 tablet    Refill:  1     Beatrice Lecher, MD

## 2020-07-22 NOTE — Assessment & Plan Note (Signed)
Discussed treatment options including topical anti-inflammatory gels like Voltaren.  Mostly relying on Tylenol because of its safety profile and occasional use of an anti-inflammatory.  We can do a trial of meloxicam.  Monitor for any GI upset or irritation.  I did warn that chronic use can affect kidney function.  If at some point it continues or worsens consider injection with Dr. Dianah Field our sports medicine doctor.  She did have a work-up for rheumatoid about 3 years ago.  Discussed with her that her symptoms right now are still most consistent with osteoarthritis but will repeat lab work.  She does have 2 family members with rheumatoid arthritis and she does have some significant swelling over the right wrist today.

## 2020-07-22 NOTE — Assessment & Plan Note (Signed)
She reports that she actually took citalopram for a while specially right around the time that her husband passed away in Feb 10, 2023.  She ran out of it and so discontinued it but would actually like to get back on it she says she tolerated it well and felt like it was effective.  New prescription sent in to restart that medication.  Follow-up in 6 months.

## 2020-07-22 NOTE — Patient Instructions (Addendum)
Recommend a trial of over-the-counter Voltaren gel.  This can be applied 3 times a day on the joint as needed.  If pain becomes more severe then consider seeing Dr. Dianah Field for further evaluation and possible joint injection.

## 2020-07-27 LAB — COMPLETE METABOLIC PANEL WITH GFR
AG Ratio: 1.8 (calc) (ref 1.0–2.5)
ALT: 20 U/L (ref 6–29)
AST: 18 U/L (ref 10–35)
Albumin: 4.4 g/dL (ref 3.6–5.1)
Alkaline phosphatase (APISO): 70 U/L (ref 37–153)
BUN: 14 mg/dL (ref 7–25)
CO2: 27 mmol/L (ref 20–32)
Calcium: 9.7 mg/dL (ref 8.6–10.4)
Chloride: 104 mmol/L (ref 98–110)
Creat: 0.96 mg/dL (ref 0.50–0.99)
GFR, Est African American: 75 mL/min/{1.73_m2} (ref >=60)
GFR, Est Non African American: 64 mL/min/{1.73_m2} (ref >=60)
Globulin: 2.5 g/dL (calc) (ref 1.9–3.7)
Glucose, Bld: 94 mg/dL (ref 65–99)
Potassium: 4.7 mmol/L (ref 3.5–5.3)
Sodium: 139 mmol/L (ref 135–146)
Total Bilirubin: 0.6 mg/dL (ref 0.2–1.2)
Total Protein: 6.9 g/dL (ref 6.1–8.1)

## 2020-07-27 LAB — LIPID PANEL
Cholesterol: 180 mg/dL (ref <200)
HDL: 45 mg/dL — ABNORMAL LOW (ref >=50)
LDL Cholesterol (Calc): 118 mg/dL (calc) — ABNORMAL HIGH
Non-HDL Cholesterol (Calc): 135 mg/dL (calc) — ABNORMAL HIGH (ref <130)
Total CHOL/HDL Ratio: 4 (calc) (ref <5.0)
Triglycerides: 78 mg/dL (ref <150)

## 2020-07-27 LAB — CBC
HCT: 42.1 % (ref 35.0–45.0)
Hemoglobin: 14.1 g/dL (ref 11.7–15.5)
MCH: 30 pg (ref 27.0–33.0)
MCHC: 33.5 g/dL (ref 32.0–36.0)
MCV: 89.6 fL (ref 80.0–100.0)
MPV: 10.1 fL (ref 7.5–12.5)
Platelets: 323 10*3/uL (ref 140–400)
RBC: 4.7 10*6/uL (ref 3.80–5.10)
RDW: 12.5 % (ref 11.0–15.0)
WBC: 6.8 10*3/uL (ref 3.8–10.8)

## 2020-07-27 LAB — ANA: Anti Nuclear Antibody (ANA): NEGATIVE

## 2020-07-27 LAB — URIC ACID: Uric Acid, Serum: 6.6 mg/dL (ref 2.5–7.0)

## 2020-07-27 LAB — SEDIMENTATION RATE: Sed Rate: 6 mm/h (ref 0–30)

## 2020-07-27 LAB — CYCLIC CITRUL PEPTIDE ANTIBODY, IGG: Cyclic Citrullin Peptide Ab: <16 UNITS

## 2020-07-29 ENCOUNTER — Other Ambulatory Visit: Payer: Self-pay

## 2020-07-29 ENCOUNTER — Ambulatory Visit (INDEPENDENT_AMBULATORY_CARE_PROVIDER_SITE_OTHER): Payer: 59

## 2020-07-29 DIAGNOSIS — Z1231 Encounter for screening mammogram for malignant neoplasm of breast: Secondary | ICD-10-CM | POA: Diagnosis not present

## 2020-08-02 ENCOUNTER — Ambulatory Visit: Payer: Self-pay | Admitting: Family Medicine

## 2020-11-18 ENCOUNTER — Telehealth: Payer: Self-pay | Admitting: Family Medicine

## 2020-11-18 NOTE — Telephone Encounter (Signed)
Pt called and left a voicemail this morning requesting a call back from Dr.Metheney's assistant, Loralee Pacas. Patient states you can call her back at [phone]

## 2020-11-22 NOTE — Telephone Encounter (Signed)
Routing to Dr. Linford Arnold for review.

## 2020-11-22 NOTE — Telephone Encounter (Signed)
Pt would like to know if Dr. Linford Arnold would see her mom.   Ana Warren 60 year old female 11/22/1934  Pt would like to get in sometime this month if at all possible

## 2020-11-23 NOTE — Telephone Encounter (Signed)
Yes I am happy to see her.

## 2020-12-29 ENCOUNTER — Telehealth: Payer: Self-pay | Admitting: *Deleted

## 2020-12-29 NOTE — Telephone Encounter (Signed)
Pt reports that she went and yesterday and had a COVID test done due to feeling really achy, headache, fatigued,congestion, low grade temp (99.9). this test was negative.   She said that she still was not feeling well and decided to take a home test and it was positive. She called her employer and they advised her to stay home until Monday.   She has been taking tylenol for the body aches. She has some congestion, slight headache. Denies any f/s/c/n/v/d or loss of appetite. She wanted to know what if anything else she should do.  I advised her to alternate between tylenol and IBU, she can try mucinex or sudafed for the congestion, increase water intake (try pedialyte), do light meals, continue to move around, stretching to help with the body aches and do some deep breathing,and continue to sanitize her home. Pt advised to call for appt or seek immediate care if her sxs changed. She voiced understanding and agreed.

## 2020-12-30 NOTE — Telephone Encounter (Signed)
Agree with documentation as above.   Hakeen Shipes, MD  

## 2021-01-20 ENCOUNTER — Ambulatory Visit: Payer: 59 | Admitting: Family Medicine

## 2021-02-21 ENCOUNTER — Other Ambulatory Visit: Payer: Self-pay | Admitting: Family Medicine

## 2021-02-21 DIAGNOSIS — F430 Acute stress reaction: Secondary | ICD-10-CM

## 2021-08-24 ENCOUNTER — Telehealth (INDEPENDENT_AMBULATORY_CARE_PROVIDER_SITE_OTHER): Payer: 59 | Admitting: Family Medicine

## 2021-08-24 ENCOUNTER — Encounter: Payer: Self-pay | Admitting: Family Medicine

## 2021-08-24 DIAGNOSIS — F430 Acute stress reaction: Secondary | ICD-10-CM | POA: Diagnosis not present

## 2021-08-24 DIAGNOSIS — F4321 Adjustment disorder with depressed mood: Secondary | ICD-10-CM

## 2021-08-24 MED ORDER — ESCITALOPRAM OXALATE 5 MG PO TABS: 5.0000 mg | ORAL_TABLET | Freq: Every day | ORAL | 1 refills | Status: DC

## 2021-08-24 NOTE — Progress Notes (Signed)
Pt reports that when she was on the Celexa it did help her to focus. During that time she was still taking half tab 10 mg.

## 2021-08-24 NOTE — Progress Notes (Signed)
Virtual Visit via Video Note  I connected with [place] on 08/24/21 at 10:50 AM EDT by a video enabled telemedicine application and verified that I am speaking with the correct person using two identifiers.   I discussed the limitations of evaluation and management by telemedicine and the availability of in person appointments. The patient expressed understanding and agreed to proceed.  Patient location: at home Provider location: in office  Subjective:    CC: Mood   HPI: Work has been really stressful.  Has felt very overwhelmed. She is sleeping OK.  Husband passed 18 mo ago. She is interested in therapy. She had to go back to work after he husband passed.  She has been trying to help with the grandchild.  She is having a hard time focusing.   Past medical history, Surgical history, Family history not pertinant except as noted below, Social history, Allergies, and medications have been entered into the medical record, reviewed, and corrections made.    Objective:    General: Speaking clearly in complete sentences without any shortness of breath.  Alert and oriented x3.  Normal judgment. No apparent acute distress.    Impression and Recommendations:    Acute reaction to stress Discussed options.  We will start Lexapro 5 mg daily.  She actually did pretty well with the half a tab of the 20 mg citalopram but when she tried to take the whole pill she said it made her feel odd.  Add Love to follow back up in about 3 to 4 weeks before her refills that we can see how she is doing and make adjustments if needed.  She is can a try to come in for a physical next month as well.  We also discussed getting in with a therapist or counselor which she would like to do.  We will go ahead and place a referral to [place] at [facility] they should be contacting her in the next week or 2.    Orders Placed This Encounter  Procedures   Ambulatory referral to Behavioral Health     Referral Priority:   Routine    Referral Type:   Psychiatric    Referral Reason:   Specialty Services Required    Requested Specialty:   Behavioral Health    Number of Visits Requested:   1    Meds ordered this encounter  Medications   escitalopram (LEXAPRO) 5 MG tablet    Sig: Take 1 tablet (5 mg total) by mouth daily.    Dispense:  30 tablet    Refill:  1   Encouraged her to schedule her flu shot when she is able.  I discussed the assessment and treatment plan with the patient. The patient was provided an opportunity to ask questions and all were answered. The patient agreed with the plan and demonstrated an understanding of the instructions.   The patient was advised to call back or seek an in-person evaluation if the symptoms worsen or if the condition fails to improve as anticipated.   Beatrice Lecher, MD

## 2021-08-24 NOTE — Assessment & Plan Note (Signed)
Discussed options.  We will start Lexapro 5 mg daily.  She actually did pretty well with the half a tab of the 20 mg citalopram but when she tried to take the whole pill she said it made her feel odd.  Add [place] to follow back up in about 3 to 4 weeks before her refills that we can see how she is doing and make adjustments if needed.  She is can a try to come in for a physical next month as well.  We also discussed getting in with a therapist or counselor which she would like to do.  We will go ahead and place a referral to [place] [facility] they should be contacting her in the next week or 2.

## 2021-09-10 ENCOUNTER — Emergency Department (INDEPENDENT_AMBULATORY_CARE_PROVIDER_SITE_OTHER): Payer: 59

## 2021-09-10 ENCOUNTER — Emergency Department
Admission: EM | Admit: 2021-09-10 | Discharge: 2021-09-10 | Disposition: A | Discharge status: Home / Self Care | Payer: 59 | Source: Home / Self Care

## 2021-09-10 ENCOUNTER — Encounter: Payer: Self-pay | Admitting: Emergency Medicine

## 2021-09-10 ENCOUNTER — Other Ambulatory Visit: Payer: Self-pay

## 2021-09-10 DIAGNOSIS — M79671 Pain in right foot: Secondary | ICD-10-CM

## 2021-09-10 DIAGNOSIS — M25474 Effusion, right foot: Secondary | ICD-10-CM | POA: Diagnosis not present

## 2021-09-10 MED ORDER — DICLOFENAC SODIUM 75 MG PO TBEC: 75.0000 mg | DELAYED_RELEASE_TABLET | Freq: Two times a day (BID) | ORAL | 0 refills | Status: AC

## 2021-09-10 NOTE — ED Triage Notes (Signed)
Pain to the bottom of her R  foot x 10 days  Bruise noted to the bottom of her foot  [place] per  pt  Denies injury  OTC ibuprofen 200mg   1- 2 times a day  No meds today

## 2021-09-10 NOTE — Discharge Instructions (Addendum)
Advised/instructed patient to take medication as directed with food to completion.  Advised patient to follow-up with podiatry contact above for further evaluation of right foot pain.

## 2021-09-10 NOTE — ED Provider Notes (Signed)
[facility]    CSN: [id number] Arrival date & time: 09/10/21  0957      History   Chief Complaint Chief Complaint  Patient presents with   Foot Pain    Right     HPI Ana Warren is a 60 y.o. female.   HPI 60 year old female presents with right foot pain x10 days.  Patient reports bruising noted bottom of her foot with mild soft tissue swelling.  Patient reports taking OTC ibuprofen 200 mg 1-2 times daily with little to no relief.  Past Medical History:  Diagnosis Date   Hx of tonsillectomy    MVP (mitral valve prolapse)    Vaginosis    recurrent    Patient Active Problem List   Diagnosis Date Noted   Grief 08/24/2021   Hyperlipidemia 07/22/2020   Primary osteoarthritis of both first carpometacarpal joints 07/22/2020   Arthralgia 07/22/2020   Heberden's nodes of both hands 07/22/2020   Acute reaction to stress 07/22/2020   BLADDER PROLAPSE 12/29/2010   POSTMENOPAUSAL STATUS 12/29/2010   HOT FLASHES 03/22/2009   UNSPECIFIED PRURITIC DISORDER 09/15/2008   GALLSTONES 07/17/2007   DYSPEPSIA 05/17/2007   HEMATURIA 05/17/2007   IRREGULAR MENSES 05/17/2007   PELVIC  PAIN 05/01/2007    Past Surgical History:  Procedure Laterality Date   CHOLECYSTECTOMY  10/08   lap   TONSILLECTOMY      OB History   No obstetric history on file.      Home Medications    Prior to Admission medications   Medication Sig Start Date End Date Taking? Authorizing Provider  diclofenac (VOLTAREN) 75 MG EC tablet Take 1 tablet (75 mg total) by mouth 2 (two) times daily for 15 days. 09/10/21 09/25/21 Yes Eliezer Lofts, FNP  escitalopram (LEXAPRO) 5 MG tablet Take 1 tablet (5 mg total) by mouth daily. 08/24/21   Hali Marry, MD    Family History Family History  Problem Relation Age of Onset   Parkinsonism Father 22   Cancer Paternal Aunt        post menopausal breast CA    Social History Social History   Tobacco Use   Smoking status: Never    Smokeless tobacco: Never  Vaping Use   Vaping Use: Never used  Substance Use Topics   Alcohol use: No   Drug use: No     Allergies   Sulfamethoxazole-trimethoprim   Review of Systems Review of Systems  Musculoskeletal:        Right foot pain x 10 days.  All other systems reviewed and are negative.   Physical Exam Triage Vital Signs ED Triage Vitals  Enc Vitals Group     BP 09/10/21 1103 (!) 143/84     Pulse Rate 09/10/21 1103 60     Resp 09/10/21 1103 15     Temp 09/10/21 1103 98.6 F (37 C)     Temp Source 09/10/21 1103 Oral     SpO2 09/10/21 1103 100 %     Weight 09/10/21 1113 175 lb 14.8 oz (79.8 kg)     Height --      Head Circumference --      Peak Flow --      Pain Score 09/10/21 1106 4     Pain Loc --      Pain Edu? --      Excl. in [place]? --    No data found.  Updated Vital Signs BP (!) 143/84 (BP Location: Right Arm)   Pulse  60   Temp 98.6 F (37 C) (Oral)   Resp 15   Wt 175 lb 14.8 oz (79.8 kg)   SpO2 100%   BMI 30.20 kg/m      Physical Exam Vitals and nursing note reviewed.  Constitutional:      General: She is not in acute distress.    Appearance: Normal appearance. She is obese. She is not ill-appearing.  HENT:     Head: Normocephalic and atraumatic.     Mouth/Throat:     Mouth: Mucous membranes are moist.     Pharynx: Oropharynx is clear.  Eyes:     Extraocular Movements: Extraocular movements intact.     Conjunctiva/sclera: Conjunctivae normal.     Pupils: Pupils are equal, round, and reactive to light.  Cardiovascular:     Rate and Rhythm: Normal rate and regular rhythm.     Pulses: Normal pulses.     Heart sounds: Normal heart sounds.  Pulmonary:     Effort: Pulmonary effort is normal.     Breath sounds: Normal breath sounds.  Musculoskeletal:        General: Normal range of motion.     Cervical back: Normal range of motion and neck supple.  Skin:    General: Skin is warm and dry.  Neurological:     General: No focal  deficit present.     Mental Status: She is alert and oriented to person, place, and time.     UC Treatments / Results  Labs (all labs ordered are listed, but only abnormal results are displayed) Labs Reviewed - No data to display  EKG   Radiology DG Foot Complete Right  Result Date: 09/10/2021 CLINICAL DATA:  pain to R foot for approx 10 days w/ swelling- bruise to bottom of foot - pain with walking - can not recall an injury EXAM: RIGHT FOOT COMPLETE - 3+ VIEW COMPARISON:  None. FINDINGS: Osteopenia. No acute fracture or dislocation. Joint spaces and alignment are maintained. No area of erosion or osseous destruction. No unexpected radiopaque foreign body. Soft tissues are unremarkable. IMPRESSION: No acute fracture or dislocation. Electronically Signed   By: Valentino Saxon M.D.   On: 09/10/2021 11:37    Procedures Procedures (including critical care time)  Medications Ordered in UC Medications - No data to display  Initial Impression / Assessment and Plan / UC Course  I have reviewed the triage vital signs and the nursing notes.  Pertinent labs & imaging results that were available during my care of the patient were reviewed by me and considered in my medical decision making (see chart for details).     MDM: 1.  Right foot pain-right foot x-ray revealed (above) Rx'd Diclofenac. Advised/instructed patient to take medication as directed with food to completion.  Advised patient to follow-up with podiatry contact above for further evaluation of right foot pain.  Patient discharged home, hemodynamically stable. Final Clinical Impressions(s) / UC Diagnoses   Final diagnoses:  Foot pain, right     Discharge Instructions      Advised/instructed patient to take medication as directed with food to completion.  Advised patient to follow-up with podiatry contact above for further evaluation of right foot pain.     ED Prescriptions     Medication Sig Dispense Auth.  Provider   diclofenac (VOLTAREN) 75 MG EC tablet Take 1 tablet (75 mg total) by mouth 2 (two) times daily for 15 days. 30 tablet Eliezer Lofts, FNP      PDMP  not reviewed this encounter.   Eliezer Lofts, [place] 09/10/21 1159

## 2021-09-22 ENCOUNTER — Ambulatory Visit (INDEPENDENT_AMBULATORY_CARE_PROVIDER_SITE_OTHER): Payer: 59 | Admitting: Podiatry

## 2021-09-22 ENCOUNTER — Other Ambulatory Visit: Payer: Self-pay

## 2021-09-22 ENCOUNTER — Ambulatory Visit (INDEPENDENT_AMBULATORY_CARE_PROVIDER_SITE_OTHER): Payer: 59

## 2021-09-22 ENCOUNTER — Encounter: Payer: Self-pay | Admitting: Podiatry

## 2021-09-22 DIAGNOSIS — M7751 Other enthesopathy of right foot: Secondary | ICD-10-CM | POA: Diagnosis not present

## 2021-09-22 DIAGNOSIS — M79671 Pain in right foot: Secondary | ICD-10-CM | POA: Diagnosis not present

## 2021-09-22 MED ORDER — DEXAMETHASONE SODIUM PHOSPHATE 120 MG/30ML IJ SOLN
4.0000 mg | Freq: Once | INTRAMUSCULAR | Status: AC
  Administered 2021-09-22: 4 mg via INTRA_ARTICULAR

## 2021-09-22 NOTE — Progress Notes (Signed)
  Subjective:  Patient ID: Ana Warren, female    DOB: [birth year],   MRN: [id number]  Chief Complaint  Patient presents with   Foot Pain    Left foot pain - patient states she went to the urgent care 2 weeks ago and they gave her a antibiotic for her inflammation. She states she stopped taking it because she was having a allergic reaction. Patient states she has been having some swelling , redness and pain     60 y.o. female presents for right ankle pain that started several weeks ago. Relates no injury. States it became swollen and painful. Mostly hurts after a long day of walking and at night. States she tried an anti-inflammatory from urgent care but had a reaction. Denies any other treamtment . Denies any other pedal complaints. Denies n/v/f/c.   Past Medical History:  Diagnosis Date   Hx of tonsillectomy    MVP (mitral valve prolapse)    Vaginosis    recurrent    Objective:  Physical Exam: Vascular: DP/PT pulses 2/4 bilateral. CFT <3 seconds. Normal hair growth on digits. Edema noted around right ankle.  Skin. No lacerations or abrasions bilateral feet.  Musculoskeletal: MMT 5/5 bilateral lower extremities in DF, PF, Inversion and Eversion. Deceased ROM in DF of ankle joint. No pain with subtalar joint. No pain on eversion inversion PF or DF. Tender over anterior ankle joint line. No pain along PT or peroneal tendons. No pain to ATFL or CFL.  Neurological: Sensation intact to light touch.   Assessment:   1. Capsulitis of right ankle      Plan:  Patient was evaluated and treated and all questions answered. -Xrays reviewed. No acute fractures or disclocations.  -Discussed ankle capsulitis and possible causes of swelling. and treatment options with patient.  -Will avoid anti-inflammatoires.  -Offered injection today. Procedure below.  -Compression anklet provided.  -Discussed if no improvement will consider MRI/PT/EPAT/PRP injections.  -Patient to return to office in 6  weeks or sooner if condition worsens.   Lorenda Peck, DPM

## 2021-11-03 ENCOUNTER — Ambulatory Visit: Payer: 59 | Admitting: Podiatry

## 2022-06-28 ENCOUNTER — Encounter: Payer: Self-pay | Admitting: Family Medicine

## 2022-06-28 ENCOUNTER — Encounter: Payer: BC Managed Care – PPO | Admitting: Sports Medicine

## 2022-06-28 ENCOUNTER — Ambulatory Visit: Payer: Self-pay

## 2022-06-28 ENCOUNTER — Ambulatory Visit (INDEPENDENT_AMBULATORY_CARE_PROVIDER_SITE_OTHER): Payer: BC Managed Care – PPO | Admitting: Family Medicine

## 2022-06-28 VITALS — BP 132/82 | Ht 64.0 in | Wt 175.0 lb

## 2022-06-28 DIAGNOSIS — M1712 Unilateral primary osteoarthritis, left knee: Secondary | ICD-10-CM | POA: Diagnosis not present

## 2022-06-28 DIAGNOSIS — M25562 Pain in left knee: Secondary | ICD-10-CM

## 2022-06-28 MED ORDER — PREDNISONE 5 MG PO TABS: ORAL_TABLET | ORAL | 0 refills | Status: DC

## 2022-06-28 NOTE — Progress Notes (Signed)
  Ana Warren - 61 y.o. female MRN [id number]  Date of birth: [birth year]  SUBJECTIVE:  Including CC & ROS.  No chief complaint on file.   Ana Warren is a 61 y.o. female that is presenting with acute left knee pain.  The pain has been ongoing for the past few weeks.  Pain is over the medial aspect.  No history of similar pain.   Review of Systems See HPI   HISTORY: Past Medical, Surgical, Social, and Family History Reviewed & Updated per EMR.   Pertinent Historical Findings include:  Past Medical History:  Diagnosis Date   Hx of tonsillectomy    MVP (mitral valve prolapse)    Vaginosis    recurrent    Past Surgical History:  Procedure Laterality Date   CHOLECYSTECTOMY  10/08   lap   TONSILLECTOMY       PHYSICAL EXAM:  VS: BP 132/82 (BP Location: Left Arm, Patient Position: Sitting)   Ht '5\' 4"'$  (1.626 m)   Wt 175 lb (79.4 kg)   BMI 30.04 kg/m  Physical Exam Gen: NAD, alert, cooperative with exam, well-appearing MSK:  Neurovascularly intact    Limited ultrasound: Left knee:  Moderate effusion suprapatellar pouch. Normal-appearing quadricep and patellar tendon. Degenerative changes of the medial joint space and meniscus with hyperemia. Mild degenerative changes in the lateral meniscus  Summary: Degenerative changes in the medial compartment  Ultrasound and interpretation by Clearance Coots, MD    ASSESSMENT & PLAN:   Primary osteoarthritis of left knee Acutely occurring.  Having degenerative changes as to the source of her pain. -Counseled on home exercise therapy and supportive care. -Prednisone. -Could consider injection, physical therapy or further imaging.

## 2022-06-28 NOTE — Patient Instructions (Signed)
Nice to meet you Please try ice  Please try the exercises   Please send me a message in MyChart with any questions or updates.  Please see me back in 4 weeks.   --Dr. Jozalynn Noyce  

## 2022-06-28 NOTE — Assessment & Plan Note (Signed)
Acutely occurring.  Having degenerative changes as to the source of her pain. -Counseled on home exercise therapy and supportive care. -Prednisone. -Could consider injection, physical therapy or further imaging.

## 2022-08-01 ENCOUNTER — Ambulatory Visit (INDEPENDENT_AMBULATORY_CARE_PROVIDER_SITE_OTHER): Payer: BC Managed Care – PPO | Admitting: Family Medicine

## 2022-08-01 ENCOUNTER — Encounter: Payer: Self-pay | Admitting: Family Medicine

## 2022-08-01 VITALS — BP 121/79 | Ht 64.0 in | Wt 175.0 lb

## 2022-08-01 DIAGNOSIS — M1712 Unilateral primary osteoarthritis, left knee: Secondary | ICD-10-CM

## 2022-08-01 MED ORDER — MELOXICAM 15 MG PO TABS: 15.0000 mg | ORAL_TABLET | Freq: Every day | ORAL | 1 refills | Status: DC | PRN

## 2022-08-01 NOTE — Assessment & Plan Note (Signed)
Has done well with modalities today.  Has mild medial sided pain from time to time. -Counseled on home exercise therapy and supportive care. -Mobic as needed. -Referral to physical therapy. -Could consider injection or further imaging.

## 2022-08-01 NOTE — Patient Instructions (Signed)
Good to see you Please use ice as needed  I have made a referral to physical therapy   Please send me a message in MyChart with any questions or updates.  Please see me back in 4 weeks or as needed if better.   --Dr. Raeford Razor

## 2022-08-01 NOTE — Progress Notes (Signed)
  LEILONI SMITHERS - 61 y.o. female MRN [id number]  Date of birth: [birth year]  SUBJECTIVE:  Including CC & ROS.  No chief complaint on file.   Martrice NATONYA FINSTAD is a 61 y.o. female that is following up for her left knee pain.  Pain has improved but she still endorses medial sided pain.  She has been able to paint her house without much disturbance.    Review of Systems See HPI   HISTORY: Past Medical, Surgical, Social, and Family History Reviewed & Updated per EMR.   Pertinent Historical Findings include:  Past Medical History:  Diagnosis Date   Hx of tonsillectomy    MVP (mitral valve prolapse)    Vaginosis    recurrent    Past Surgical History:  Procedure Laterality Date   CHOLECYSTECTOMY  10/08   lap   TONSILLECTOMY       PHYSICAL EXAM:  VS: BP 121/79 (BP Location: Left Arm, Patient Position: Sitting)   Ht '5\' 4"'$  (1.626 m)   Wt 175 lb (79.4 kg)   BMI 30.04 kg/m  Physical Exam Gen: NAD, alert, cooperative with exam, well-appearing MSK:  Neurovascularly intact       ASSESSMENT & PLAN:   Primary osteoarthritis of left knee Has done well with modalities today.  Has mild medial sided pain from time to time. -Counseled on home exercise therapy and supportive care. -Mobic as needed. -Referral to physical therapy. -Could consider injection or further imaging.

## 2023-03-05 ENCOUNTER — Encounter: Payer: Self-pay | Admitting: *Deleted

## 2023-03-16 ENCOUNTER — Ambulatory Visit (INDEPENDENT_AMBULATORY_CARE_PROVIDER_SITE_OTHER): Payer: BC Managed Care – PPO | Admitting: Family Medicine

## 2023-03-16 ENCOUNTER — Encounter: Payer: Self-pay | Admitting: Family Medicine

## 2023-03-16 VITALS — BP 128/65 | HR 77 | Ht 64.0 in

## 2023-03-16 DIAGNOSIS — R300 Dysuria: Secondary | ICD-10-CM | POA: Diagnosis not present

## 2023-03-16 DIAGNOSIS — M1712 Unilateral primary osteoarthritis, left knee: Secondary | ICD-10-CM | POA: Diagnosis not present

## 2023-03-16 DIAGNOSIS — N3000 Acute cystitis without hematuria: Secondary | ICD-10-CM

## 2023-03-16 LAB — POCT URINALYSIS DIP (CLINITEK)
Bilirubin, UA: NEGATIVE
Glucose, UA: NEGATIVE mg/dL
Ketones, POC UA: NEGATIVE mg/dL
Nitrite, UA: POSITIVE — AB
POC PROTEIN,UA: 30 — AB
Spec Grav, UA: 1.025 (ref 1.010–1.025)
Urobilinogen, UA: 1 E.U./dL
pH, UA: 7 (ref 5.0–8.0)

## 2023-03-16 MED ORDER — ESCITALOPRAM OXALATE 5 MG PO TABS: 5.0000 mg | ORAL_TABLET | Freq: Every day | ORAL | 1 refills | Status: DC

## 2023-03-16 MED ORDER — NITROFURANTOIN MONOHYD MACRO 100 MG PO CAPS: 100.0000 mg | ORAL_CAPSULE | Freq: Two times a day (BID) | ORAL | 0 refills | Status: DC

## 2023-03-16 MED ORDER — MELOXICAM 15 MG PO TABS: 15.0000 mg | ORAL_TABLET | Freq: Every day | ORAL | 1 refills | Status: DC | PRN

## 2023-03-16 NOTE — Progress Notes (Signed)
Pt reports that her sxs started about 1 week ago she has been having burning,urgency, and last night she had some pain. She took Azo

## 2023-03-16 NOTE — Progress Notes (Signed)
Acute Office Visit  Subjective:     Patient ID: Ana Warren, female    DOB: May 17, 1961, 62 y.o.   MRN: [id number]  Chief Complaint  Patient presents with   Urinary Tract Infection    HPI Patient is in today for  Pt reports that her sxs started about 1 week ago she has been having burning,urgency, and last night she had some pain. She took Azo     Does need some refills on her Lexapro as well.  And her anti-inflammatory for her knee.  ROS      Objective:    BP 128/65   Pulse 77   Ht 5\' 4"  (1.626 m)   SpO2 96%   BMI 30.04 kg/m    Physical Exam Vitals reviewed.  Constitutional:      Appearance: She is well-developed.  HENT:     Head: Normocephalic and atraumatic.  Eyes:     Conjunctiva/sclera: Conjunctivae normal.  Cardiovascular:     Rate and Rhythm: Normal rate.  Pulmonary:     Effort: Pulmonary effort is normal.  Abdominal:     Comments: No abdominal pain.  Musculoskeletal:     Comments: No CVA tenderness.  Skin:    General: Skin is dry.     Coloration: Skin is not pale.  Neurological:     Mental Status: She is alert and oriented to person, place, and time.  Psychiatric:        Behavior: Behavior normal.     Results for orders placed or performed in visit on 03/16/23  POCT URINALYSIS DIP (CLINITEK)  Result Value Ref Range   Color, UA yellow yellow   Clarity, UA clear clear   Glucose, UA negative negative mg/dL   Bilirubin, UA negative negative   Ketones, POC UA negative negative mg/dL   Spec Grav, UA 7.253 6.644 - 1.025   Blood, UA small (A) negative   pH, UA 7.0 5.0 - 8.0   POC PROTEIN,UA =30 (A) negative, trace   Urobilinogen, UA 1.0 0.2 or 1.0 E.U./dL   Nitrite, UA Positive (A) Negative   Leukocytes, UA Small (1+) (A) Negative        Assessment & Plan:   Problem List Items Addressed This Visit       Musculoskeletal and Integument   Primary osteoarthritis of left knee    Also recommended trial of Voltaren gel she might find  good relief with the topical agent.  Go ahead and refill the meloxicam as well.      Relevant Medications   meloxicam (MOBIC) 15 MG tablet   Other Visit Diagnoses     Dysuria    -  Primary   Relevant Orders   POCT URINALYSIS DIP (CLINITEK) (Completed)   Urine Culture   Acute cystitis without hematuria       Relevant Medications   nitrofurantoin, macrocrystal-monohydrate, (MACROBID) 100 MG capsule   Other Relevant Orders   Urine Culture      Urinalysis shows positive leukocytes protein and blood.  Will go ahead and treat for urinary tract infection.  If symptoms not improved then let us know after the weekend.  I do want to follow-up on the blood and protein and make sure that that is cleared up so recommend repeat urinalysis in about 2 weeks.  Meds ordered this encounter  Medications   nitrofurantoin, macrocrystal-monohydrate, (MACROBID) 100 MG capsule    Sig: Take 1 capsule (100 mg total) by mouth 2 (two) times daily.  Dispense:  10 capsule    Refill:  0   escitalopram (LEXAPRO) 5 MG tablet    Sig: Take 1 tablet (5 mg total) by mouth daily.    Dispense:  90 tablet    Refill:  1   meloxicam (MOBIC) 15 MG tablet    Sig: Take 1 tablet (15 mg total) by mouth daily as needed. One tab PO qAM with breakfast for 2 weeks, then daily prn pain.    Dispense:  45 tablet    Refill:  1    No follow-ups on file.  Nani Gasser, MD

## 2023-03-16 NOTE — Assessment & Plan Note (Signed)
Also recommended trial of Voltaren gel she might find good relief with the topical agent.  Go ahead and refill the meloxicam as well.

## 2023-03-28 ENCOUNTER — Telehealth: Payer: Self-pay | Admitting: Family Medicine

## 2023-03-28 DIAGNOSIS — Z1231 Encounter for screening mammogram for malignant neoplasm of breast: Secondary | ICD-10-CM

## 2023-03-28 NOTE — Telephone Encounter (Signed)
Please call pt: need mammo,  Maybe sees GYN?

## 2023-03-29 NOTE — Telephone Encounter (Signed)
Called pt and she informed me that these are both done by Dr. Linford Arnold.   Mammogram ordered and pt transferred to scheduling for CPE/pap

## 2023-04-03 ENCOUNTER — Ambulatory Visit (INDEPENDENT_AMBULATORY_CARE_PROVIDER_SITE_OTHER): Payer: BC Managed Care – PPO | Admitting: Family Medicine

## 2023-04-03 ENCOUNTER — Encounter: Payer: Self-pay | Admitting: Family Medicine

## 2023-04-03 VITALS — BP 119/54 | HR 65 | Ht 64.0 in | Wt 178.0 lb

## 2023-04-03 DIAGNOSIS — R300 Dysuria: Secondary | ICD-10-CM

## 2023-04-03 DIAGNOSIS — R3129 Other microscopic hematuria: Secondary | ICD-10-CM | POA: Diagnosis not present

## 2023-04-03 DIAGNOSIS — Z1211 Encounter for screening for malignant neoplasm of colon: Secondary | ICD-10-CM

## 2023-04-03 LAB — POCT URINALYSIS DIP (CLINITEK)
Bilirubin, UA: NEGATIVE
Glucose, UA: NEGATIVE mg/dL
Ketones, POC UA: NEGATIVE mg/dL
Nitrite, UA: NEGATIVE
POC PROTEIN,UA: NEGATIVE
Spec Grav, UA: >=1.03 — AB (ref 1.010–1.025)
Urobilinogen, UA: 0.2 E.U./dL
pH, UA: 5.5 (ref 5.0–8.0)

## 2023-04-03 NOTE — Progress Notes (Signed)
   Acute Office Visit  Subjective:     Patient ID: Ana Warren, female    DOB: 01/08/1961, 62 y.o.   MRN: [id number]  Chief Complaint  Patient presents with   Urinary Tract Infection    Pt reports that her sxs did not go away completely     HPI Patient is in today for dysuria..  She came in for UTI about 3 and half weeks ago.  She says ever since then she feels better than she did but she still feels like there is a little bit of urgency and discomfort going on.  No fevers or chills.  She did have a stomach bug about a week ago and wondered if that could have even set some of her symptoms off.  ROS      Objective:    BP (!) 119/54   Pulse 65   Ht 5\' 4"  (1.626 m)   Wt 178 lb (80.7 kg)   SpO2 97%   BMI 30.55 kg/m    Physical Exam Vitals and nursing note reviewed.  Constitutional:      Appearance: She is well-developed.  HENT:     Head: Normocephalic and atraumatic.  Cardiovascular:     Rate and Rhythm: Normal rate and regular rhythm.     Heart sounds: Normal heart sounds.  Pulmonary:     Effort: Pulmonary effort is normal.     Breath sounds: Normal breath sounds.  Skin:    General: Skin is warm and dry.  Neurological:     Mental Status: She is alert and oriented to person, place, and time.  Psychiatric:        Behavior: Behavior normal.     Results for orders placed or performed in visit on 04/03/23  POCT URINALYSIS DIP (CLINITEK)  Result Value Ref Range   Color, UA yellow yellow   Clarity, UA clear clear   Glucose, UA negative negative mg/dL   Bilirubin, UA negative negative   Ketones, POC UA negative negative mg/dL   Spec Grav, UA >=7.829 (A) 1.010 - 1.025   Blood, UA trace-lysed (A) negative   pH, UA 5.5 5.0 - 8.0   POC PROTEIN,UA negative negative, trace   Urobilinogen, UA 0.2 0.2 or 1.0 E.U./dL   Nitrite, UA Negative Negative   Leukocytes, UA Trace (A) Negative        Assessment & Plan:   Problem List Items Addressed This Visit    None Visit Diagnoses     Dysuria    -  Primary   Relevant Orders   POCT URINALYSIS DIP (CLINITEK) (Completed)   Screen for colon cancer       Relevant Orders   Ambulatory referral to Gastroenterology   Microscopic hematuria       Relevant Orders   Urine Culture   Urinalysis, microscopic only      Dysuria-urinalysis looks negative for acute infection but will send for culture to confirm that the infection has cleared.  There is a little bit of trace hemoglobin so we will send for microscopic review as well.  She is scheduled her Pap smear for July 1 and is going to try to schedule her mammogram for July 1 as well.  She is okay with going ahead and placing referral for screening colonoscopy with digestive health specialists.    No orders of the defined types were placed in this encounter.   No follow-ups on file.  Nani Gasser, MD

## 2023-04-04 LAB — URINE CULTURE
MICRO NUMBER:: 14953939
SPECIMEN QUALITY:: ADEQUATE

## 2023-04-04 LAB — URINALYSIS, MICROSCOPIC ONLY
Bacteria, UA: NONE SEEN /HPF
Hyaline Cast: NONE SEEN /LPF
Squamous Epithelial / HPF: NONE SEEN /HPF (ref <=5)

## 2023-04-04 NOTE — Progress Notes (Signed)
HI Simrin, no excess red blood cells in the urine on the microscopic review which is great.  Culture still pending.

## 2023-04-05 NOTE — Progress Notes (Signed)
Hi Ana Warren, final urine culture is negative so no UTI.

## 2023-06-11 ENCOUNTER — Other Ambulatory Visit: Payer: Self-pay | Admitting: Family Medicine

## 2023-06-11 DIAGNOSIS — Z1231 Encounter for screening mammogram for malignant neoplasm of breast: Secondary | ICD-10-CM

## 2023-06-12 ENCOUNTER — Other Ambulatory Visit: Payer: Self-pay | Admitting: Family Medicine

## 2023-06-12 ENCOUNTER — Telehealth: Payer: Self-pay | Admitting: Family Medicine

## 2023-06-12 DIAGNOSIS — Z0000 Encounter for general adult medical examination without abnormal findings: Secondary | ICD-10-CM

## 2023-06-12 NOTE — Telephone Encounter (Signed)
Patient is requesting a call back concerning a UTI she wants to drop off a urine specimen  [phone]

## 2023-06-12 NOTE — Telephone Encounter (Signed)
Patient is requesting a call back from [place] about UTI [phone]

## 2023-06-12 NOTE — Telephone Encounter (Signed)
Pt will need to do a NV for UTI sxs. Lab orders placed.

## 2023-06-12 NOTE — Addendum Note (Signed)
Addended by: Deno Etienne on: 06/12/2023 02:49 PM   Modules accepted: Orders

## 2023-06-13 ENCOUNTER — Ambulatory Visit (INDEPENDENT_AMBULATORY_CARE_PROVIDER_SITE_OTHER): Payer: BC Managed Care – PPO | Admitting: Family Medicine

## 2023-06-13 ENCOUNTER — Ambulatory Visit (INDEPENDENT_AMBULATORY_CARE_PROVIDER_SITE_OTHER): Payer: BC Managed Care – PPO

## 2023-06-13 VITALS — BP 125/65 | HR 78 | Ht 64.0 in | Wt 178.0 lb

## 2023-06-13 DIAGNOSIS — Z0000 Encounter for general adult medical examination without abnormal findings: Secondary | ICD-10-CM | POA: Diagnosis not present

## 2023-06-13 DIAGNOSIS — R300 Dysuria: Secondary | ICD-10-CM | POA: Diagnosis not present

## 2023-06-13 DIAGNOSIS — Z1231 Encounter for screening mammogram for malignant neoplasm of breast: Secondary | ICD-10-CM | POA: Diagnosis not present

## 2023-06-13 LAB — POCT URINALYSIS DIP (CLINITEK)
Bilirubin, UA: NEGATIVE
Glucose, UA: NEGATIVE mg/dL
Ketones, POC UA: NEGATIVE mg/dL
Nitrite, UA: NEGATIVE
POC PROTEIN,UA: NEGATIVE
Spec Grav, UA: >=1.03 — AB (ref 1.010–1.025)
Urobilinogen, UA: 0.2 E.U./dL
pH, UA: 5.5 (ref 5.0–8.0)

## 2023-06-13 NOTE — Progress Notes (Signed)
Pt has had uti sxs that started 5 days ago she tried Azo for 2 days she felt that this made her sxs worse.

## 2023-06-13 NOTE — Progress Notes (Signed)
OK to send for culture

## 2023-06-14 ENCOUNTER — Other Ambulatory Visit: Payer: Self-pay | Admitting: *Deleted

## 2023-06-14 DIAGNOSIS — R300 Dysuria: Secondary | ICD-10-CM | POA: Diagnosis not present

## 2023-06-14 LAB — CBC WITH DIFFERENTIAL/PLATELET
EOS (ABSOLUTE): 0.3 10*3/uL (ref 0.0–0.4)
Eos: 5 %
Hematocrit: 43 % (ref 34.0–46.6)
Hemoglobin: 13.8 g/dL (ref 11.1–15.9)
Immature Grans (Abs): 0 10*3/uL (ref 0.0–0.1)
Immature Granulocytes: 1 %
Lymphocytes Absolute: 1.8 10*3/uL (ref 0.7–3.1)
Lymphs: 28 %
MCH: 30.1 pg (ref 26.6–33.0)
MCHC: 32.1 g/dL (ref 31.5–35.7)
MCV: 94 fL (ref 79–97)
Monocytes Absolute: 0.4 10*3/uL (ref 0.1–0.9)
Monocytes: 6 %
Platelets: 304 10*3/uL (ref 150–450)
RBC: 4.58 x10E6/uL (ref 3.77–5.28)
RDW: 13.3 % (ref 11.7–15.4)
WBC: 6.4 10*3/uL (ref 3.4–10.8)

## 2023-06-14 LAB — CMP14+EGFR
ALT: 15 IU/L (ref 0–32)
AST: 17 IU/L (ref 0–40)
Albumin: 4.3 g/dL (ref 3.9–4.9)
Alkaline Phosphatase: 71 IU/L (ref 44–121)
BUN/Creatinine Ratio: 17 (ref 12–28)
BUN: 14 mg/dL (ref 8–27)
Bilirubin Total: 0.5 mg/dL (ref 0.0–1.2)
Calcium: 9.3 mg/dL (ref 8.7–10.3)
Chloride: 106 mmol/L (ref 96–106)
Creatinine, Ser: 0.82 mg/dL (ref 0.57–1.00)
Globulin, Total: 2.2 g/dL (ref 1.5–4.5)
Sodium: 141 mmol/L (ref 134–144)
Total Protein: 6.5 g/dL (ref 6.0–8.5)
eGFR: 80 mL/min/{1.73_m2} (ref >59)

## 2023-06-14 LAB — LIPID PANEL
Chol/HDL Ratio: 4.2 ratio (ref 0.0–4.4)
Cholesterol, Total: 196 mg/dL (ref 100–199)
HDL: 47 mg/dL (ref >39)
LDL Chol Calc (NIH): 126 mg/dL — ABNORMAL HIGH (ref 0–99)
Triglycerides: 129 mg/dL (ref 0–149)
VLDL Cholesterol Cal: 23 mg/dL (ref 5–40)

## 2023-06-14 NOTE — Progress Notes (Signed)
Please call patient. Normal mammogram.  Repeat in 1 year.  

## 2023-06-15 ENCOUNTER — Telehealth: Payer: Self-pay | Admitting: Family Medicine

## 2023-06-15 NOTE — Progress Notes (Signed)
Hi Ana Warren, LDL cholesterol is elevated at 126 goal is less than 100.  Your blood count looks great no anemia.  And metabolic panel is normal as well we can always review further at your next office visit.  The 10-year ASCVD risk score (Arnett DK, et al., 2019) is: 4.6%   Values used to calculate the score:     Age: 62 years     Sex: Female     Is Non-Hispanic African American: No     Diabetic: No     Tobacco smoker: No     Systolic Blood Pressure: 125 mmHg     Is BP treated: No     HDL Cholesterol: 47 mg/dL     Total Cholesterol: 196 mg/dL

## 2023-06-15 NOTE — Progress Notes (Signed)
Urinalysis just positive for leukocytes.  Culture sent for confirmation as urine culture in February and in May were negative.  Develops new or worsening symptoms or fever chills or vomiting please let us know immediately.  Sure hydrating well okay to use over-the-counter Azo for discomfort.  And if symptoms persist we may need to consider alternative diagnoses such as interstitial cystitis and consider referral to urology.

## 2023-06-15 NOTE — Telephone Encounter (Signed)
Patient is requesting a call back for results for labs and medication Phone # [phone]

## 2023-06-18 ENCOUNTER — Encounter: Payer: Self-pay | Admitting: Family Medicine

## 2023-06-18 ENCOUNTER — Ambulatory Visit (INDEPENDENT_AMBULATORY_CARE_PROVIDER_SITE_OTHER): Payer: BC Managed Care – PPO | Admitting: Family Medicine

## 2023-06-18 ENCOUNTER — Other Ambulatory Visit (HOSPITAL_COMMUNITY)
Admission: RE | Admit: 2023-06-18 | Discharge: 2023-06-18 | Disposition: A | Discharge status: Home / Self Care | Payer: BC Managed Care – PPO | Source: Ambulatory Visit | Attending: Family Medicine | Admitting: Family Medicine

## 2023-06-18 VITALS — BP 131/70 | HR 65 | Ht 64.0 in | Wt 177.0 lb

## 2023-06-18 DIAGNOSIS — Z124 Encounter for screening for malignant neoplasm of cervix: Secondary | ICD-10-CM

## 2023-06-18 NOTE — Telephone Encounter (Signed)
Will discuss w/ pt at appt today

## 2023-06-18 NOTE — Progress Notes (Signed)
Culture came back negative today so no sign of active infection just may be some irritation or inflammation.

## 2023-06-18 NOTE — Progress Notes (Signed)
Complete physical exam  Patient: Ana Warren   DOB: May 21, 1961   63 y.o. Female  MRN: [id number]  Subjective:    Chief Complaint  Patient presents with   Annual Exam    Ana Warren is a 62 y.o. female who presents today for a complete physical exam. She reports consuming a general diet. The patient does not participate in regular exercise at present. She generally feels well.  She does not have additional problems to discuss today.    Most recent fall risk assessment:    06/13/2023    3:45 PM  Fall Risk   Falls in the past year? 0  Number falls in past yr: 0  Injury with Fall? 0  Risk for fall due to : No Fall Risks  Follow up Falls evaluation completed     Most recent depression screenings:    06/13/2023    3:45 PM 04/03/2023   11:05 AM  PHQ 2/9 Scores  PHQ - 2 Score 0 0        Patient Care Team: Agapito Games, MD as PCP - General (Family Medicine)   Outpatient Medications Prior to Visit  Medication Sig   escitalopram (LEXAPRO) 5 MG tablet Take 1 tablet (5 mg total) by mouth daily.   meloxicam (MOBIC) 15 MG tablet Take 1 tablet (15 mg total) by mouth daily as needed. One tab PO qAM with breakfast for 2 weeks, then daily prn pain.   No facility-administered medications prior to visit.    ROS        Objective:     BP 131/70   Pulse 65   Ht 5\' 4"  (1.626 m)   Wt 177 lb (80.3 kg)   SpO2 97%   BMI 30.38 kg/m    Physical Exam Vitals and nursing note reviewed. Exam conducted with a chaperone present.  Constitutional:      Appearance: She is well-developed.  HENT:     Head: Normocephalic and atraumatic.     Right Ear: External ear normal.     Left Ear: External ear normal.     Nose: Nose normal.  Eyes:     Conjunctiva/sclera: Conjunctivae normal.     Pupils: Pupils are equal, round, and reactive to light.  Neck:     Thyroid: No thyromegaly.  Cardiovascular:     Rate and Rhythm: Normal rate and regular rhythm.     Heart  sounds: Normal heart sounds.  Pulmonary:     Effort: Pulmonary effort is normal.     Breath sounds: Normal breath sounds. No wheezing.  Abdominal:     General: Bowel sounds are normal.     Palpations: Abdomen is soft.  Genitourinary:    General: Normal vulva.     Labia:        Right: No rash.        Left: No rash.      Vagina: Normal.     Cervix: Normal.     Uterus: Normal.      Adnexa: Right adnexa normal and left adnexa normal.  Musculoskeletal:     Cervical back: Neck supple.  Lymphadenopathy:     Cervical: No cervical adenopathy.  Skin:    General: Skin is warm and dry.  Neurological:     Mental Status: She is alert and oriented to person, place, and time.  Psychiatric:        Behavior: Behavior normal.      No results found for any visits on  06/18/23.     Assessment & Plan:    Routine Health Maintenance and Physical Exam  Immunization History  Administered Date(s) Administered   Influenza,inj,Quad PF,6+ Mos 10/08/2018   Td 09/07/2006   Tdap 05/15/2017    Health Maintenance  Topic Date Due   Colonoscopy  Never done   PAP SMEAR-Modifier  05/15/2022   Zoster Vaccines- Shingrix (1 of 2) 07/04/2023 (Originally 02/19/2010)   COVID-19 Vaccine (1 - 2023-24 season) 04/18/2024 (Originally 07/21/2022)   HIV Screening  12/23/2028 (Originally 02/20/1975)   INFLUENZA VACCINE  06/21/2023   MAMMOGRAM  06/12/2025   DTaP/Tdap/Td (3 - Td or Tdap) 05/16/2027   Hepatitis C Screening  Completed   Pneumococcal Vaccine 70-31 Years old  Aged Out   HPV VACCINES  Aged Out    Discussed health benefits of physical activity, and encouraged her to engage in regular exercise appropriate for her age and condition.  Problem List Items Addressed This Visit   None Visit Diagnoses     Screening for cervical cancer    -  Primary   Relevant Orders   Cytology - PAP       Keep up a regular exercise program and make sure you are eating a healthy diet Try to eat 4 servings of dairy a day,  or if you are lactose intolerant take a calcium with vitamin D daily.  Your vaccines are up to date.  Will call with pap results.  Reviewed recent labs.  Colonoscopy is scheduled in oct   Return in about 2 months (around 08/21/2023) for cryotherapy on skin lesion on back and right lower leg. Nani Gasser, MD

## 2023-06-19 NOTE — Progress Notes (Signed)
This was discussed w/pt at OV

## 2023-06-21 NOTE — Progress Notes (Signed)
Your Pap smear is normal. You are negative for HPV as well. Repeat pap smear in 5 years.

## 2024-03-24 ENCOUNTER — Ambulatory Visit: Payer: Self-pay

## 2024-03-24 NOTE — Telephone Encounter (Signed)
 Chief Complaint: productive cough Symptoms: productive cough with green mucus, headache, chest congestion Frequency: x 8 weeks Pertinent Negatives: Patient denies hemoptysis Disposition: [] ED /[] Urgent Care (no appt availability in office) / [x] Appointment(In office/virtual)/ []  [facility]/ [] Home Care/ [] Refused Recommended Disposition /[] [facility]/ []  Follow-up with PCP Additional Notes: Patient states she has custody of her grandkids and her 16 year old grandchild had flu A a few weeks ago. She did a home self test and it was negative but she states she is not sure if it was a false negative. The cough started with congestion and headache, states the cough will not go away. Patient agreeable to acute visit with PCP tomorrow.  Copied from CRM [id number]. Topic: Clinical - Red Word Triage >> Mar 24, 2024 11:04 AM Maryln Sober wrote: Red Word that prompted transfer to Nurse Triage: Patient states for the last 8 weeks she has had a cough that wont go away, she states she does experience some shortness of breath and tightness when she does physical activity. Reason for Disposition  SEVERE coughing spells (e.g., whooping sound after coughing, vomiting after coughing)  Answer Assessment - Initial Assessment Questions 1. ONSET: "When did the cough begin?"      8 weeks ago.  2. SEVERITY: "How bad is the cough today?"      She states after coughing spells she has chest tightness and loses her breath with it.   3. SPUTUM: "Describe the color of your sputum" (none, dry cough; clear, white, yellow, green)     Green.  4. HEMOPTYSIS: "Are you coughing up any blood?" If so ask: "How much?" (flecks, streaks, tablespoons, etc.)     Denies.  5. DIFFICULTY BREATHING: "Are you having difficulty breathing?" If Yes, ask: "How bad is it?" (e.g., mild, moderate, severe)    - MILD: No SOB at rest, mild SOB with walking, speaks normally in sentences, can lie down, no retractions, pulse < 100.     - MODERATE: SOB at rest, SOB with minimal exertion and prefers to sit, cannot lie down flat, speaks in phrases, mild retractions, audible wheezing, pulse 100-120.    - SEVERE: Very SOB at rest, speaks in single words, struggling to breathe, sitting hunched forward, retractions, pulse > 120      Chest tightness and SOB after coughing.  6. FEVER: "Do you have a fever?" If Yes, ask: "What is your temperature, how was it measured, and when did it start?"     She states about a week or 2 ago and denies any since then.  7. CARDIAC HISTORY: "Do you have any history of heart disease?" (e.g., heart attack, congestive heart failure)      Denies.  8. LUNG HISTORY: "Do you have any history of lung disease?"  (e.g., pulmonary embolus, asthma, emphysema)     Denies.  9. PE RISK FACTORS: "Do you have a history of blood clots?" (or: recent major surgery, recent prolonged travel, bedridden)     Denies.  10. OTHER SYMPTOMS: "Do you have any other symptoms?" (e.g., runny nose, wheezing, chest pain)       Headache, chest congestion (started with it).  11. PREGNANCY: "Is there any chance you are pregnant?" "When was your last menstrual period?"       N/A.  12. TRAVEL: "Have you traveled out of the country in the last month?" (e.g., travel history, exposures)       Exposure to flu A with her grandchild.  Protocols used: Cough -  Acute Productive-A-AH

## 2024-03-25 ENCOUNTER — Encounter: Payer: Self-pay | Admitting: Family Medicine

## 2024-03-25 ENCOUNTER — Ambulatory Visit (INDEPENDENT_AMBULATORY_CARE_PROVIDER_SITE_OTHER): Admitting: Family Medicine

## 2024-03-25 ENCOUNTER — Ambulatory Visit

## 2024-03-25 VITALS — BP 134/66 | HR 72 | Ht 64.0 in | Wt 171.1 lb

## 2024-03-25 DIAGNOSIS — R053 Chronic cough: Secondary | ICD-10-CM

## 2024-03-25 DIAGNOSIS — R059 Cough, unspecified: Secondary | ICD-10-CM | POA: Diagnosis not present

## 2024-03-25 DIAGNOSIS — R079 Chest pain, unspecified: Secondary | ICD-10-CM | POA: Diagnosis not present

## 2024-03-25 DIAGNOSIS — R946 Abnormal results of thyroid function studies: Secondary | ICD-10-CM

## 2024-03-25 MED ORDER — FLUTICASONE PROPIONATE 50 MCG/ACT NA SUSP
2.0000 | Freq: Every day | NASAL | 3 refills | Status: AC
Start: 2024-03-25 — End: ?

## 2024-03-25 MED ORDER — PANTOPRAZOLE SODIUM 20 MG PO TBEC: 20.0000 mg | DELAYED_RELEASE_TABLET | Freq: Every day | ORAL | 0 refills | Status: AC

## 2024-03-25 NOTE — Progress Notes (Addendum)
 Acute Office Visit  Subjective:     Patient ID: Ana Warren, female    DOB: [birth year], 63 y.o.   MRN: [id number]  Chief Complaint  Patient presents with   Cough    HPI Patient is in today for cough x 8 weeks.  Started initially after her grandkids were sick.  One of them actually tested positive for the flu but she actually tested negative for flu and negative for COVID.  She did run a fever initially she eventually started feeling better but the cough persisted she still been getting some phlegm with it.  She denies any sinus pressure or significant nasal congestion or drainage.  No significant shortness of breath.  No other major changes.  She has been under a little bit more stress in the last year taking care of her grandchildren after her son passed away they are hoping to get full custody either this summer at the end of the summer.  Mom of the kids does not seem very involved.  But they are doing well.  ROS      Objective:    BP 134/66   Pulse 72   Ht 5\' 4"  (1.626 m)   Wt 171 lb 1.6 oz (77.6 kg)   SpO2 99%   BMI 29.37 kg/m     Physical Exam Constitutional:      Appearance: Normal appearance.  HENT:     Head: Normocephalic and atraumatic.     Right Ear: Tympanic membrane, ear canal and external ear normal. There is no impacted cerumen.     Left Ear: Tympanic membrane, ear canal and external ear normal. There is no impacted cerumen.     Nose: Nose normal.     Mouth/Throat:     Pharynx: Oropharynx is clear.  Eyes:     Conjunctiva/sclera: Conjunctivae normal.  Neck:     Comments: Thyroid is borderline enlarged but interestingly she has what almost feels like crepitus on the right side of the gland it is really unusual feels almost like a cracking or popping when I press on that area.  No significant erythema or tenderness or induration. Cardiovascular:     Rate and Rhythm: Normal rate and regular rhythm.  Pulmonary:     Effort: Pulmonary effort is normal.      Breath sounds: Normal breath sounds.  Musculoskeletal:     Cervical back: Neck supple. No tenderness.  Lymphadenopathy:     Cervical: No cervical adenopathy.  Skin:    General: Skin is warm and dry.  Neurological:     Mental Status: She is alert and oriented to person, place, and time.  Psychiatric:        Mood and Affect: Mood normal.     No results found for any visits on 03/25/24.      Assessment & Plan:   Problem List Items Addressed This Visit   None Visit Diagnoses       Chronic cough    -  Primary   Relevant Medications   fluticasone (FLONASE) 50 MCG/ACT nasal spray   pantoprazole (PROTONIX) 20 MG tablet   Other Relevant Orders   DG Chest 2 View   Bordetella Pertussis PCR     Abnormal thyroid exam       Relevant Orders   US  THYROID      Chronic cough-we discussed further workup today symptoms been going on for 8 weeks will check for pertussis and get a chest x-ray.  Will also start fluticasone  nasal spray as well as a reflux medicine for the next 2 to 3 weeks to see if that improves her symptoms.  If everything is normal and not improving then consider ENT referral.  Abnormal cervical neck exam-will get an ultrasound for further workup.  Meds ordered this encounter  Medications   fluticasone (FLONASE) 50 MCG/ACT nasal spray    Sig: Place 2 sprays into both nostrils daily.    Dispense:  16 g    Refill:  3   pantoprazole (PROTONIX) 20 MG tablet    Sig: Take 1 tablet (20 mg total) by mouth at bedtime.    Dispense:  30 tablet    Refill:  0    No follow-ups on file.  Duaine German, MD

## 2024-03-25 NOTE — Patient Instructions (Signed)
 Call if not improving for the next 2 to 3 weeks.

## 2024-03-27 ENCOUNTER — Encounter: Payer: Self-pay | Admitting: Family Medicine

## 2024-03-27 LAB — BORDETELLA PERTUSSIS PCR
B. parapertussis DNA: NEGATIVE
B. pertussis DNA: NEGATIVE

## 2024-03-27 NOTE — Progress Notes (Signed)
 Hi Ana Warren, you are negative for pertussis.  So we will move forward with our plan hopefully you will start to improve over the next week.

## 2024-03-27 NOTE — Progress Notes (Signed)
 HI Ana Warren your chest xray looks great!!!!  Let move forward with our plan and see if you start feeling better.

## 2024-03-31 ENCOUNTER — Ambulatory Visit

## 2024-03-31 DIAGNOSIS — R946 Abnormal results of thyroid function studies: Secondary | ICD-10-CM | POA: Diagnosis not present

## 2024-03-31 DIAGNOSIS — E042 Nontoxic multinodular goiter: Secondary | ICD-10-CM | POA: Diagnosis not present

## 2024-04-02 ENCOUNTER — Ambulatory Visit: Payer: Self-pay | Admitting: Family Medicine

## 2024-04-02 NOTE — Progress Notes (Signed)
 Ana Warren, ultrasound of the thyroid shows a 1.1 cm thyroid nodule on the right side.  They do not recommend biopsy at this point but they do recommend that we do another ultrasound in 1 year.  You had some smaller nodules as well but those also were not concerning no abnormal features.  So they did not recommend any specific follow-up.  But again we will get an ultrasound in 1 year and they will look at everything.

## 2024-07-22 ENCOUNTER — Encounter: Payer: Self-pay | Admitting: Sports Medicine

## 2024-10-04 DIAGNOSIS — F4323 Adjustment disorder with mixed anxiety and depressed mood: Secondary | ICD-10-CM | POA: Diagnosis not present

## 2024-10-13 DIAGNOSIS — F4323 Adjustment disorder with mixed anxiety and depressed mood: Secondary | ICD-10-CM | POA: Diagnosis not present
